# Patient Record
Sex: Female | Born: 1935 | Race: White | Hispanic: No
Health system: Southern US, Community
[De-identification: ages and names within clinical notes are randomized; demographics above are authoritative.]

## PROBLEM LIST (undated history)

## (undated) DIAGNOSIS — F0390 Unspecified dementia without behavioral disturbance: Secondary | ICD-10-CM

## (undated) DIAGNOSIS — I10 Essential (primary) hypertension: Secondary | ICD-10-CM

## (undated) DIAGNOSIS — F0280 Dementia in other diseases classified elsewhere without behavioral disturbance: Secondary | ICD-10-CM

---

## 2020-03-02 ENCOUNTER — Other Ambulatory Visit: Payer: Self-pay

## 2020-03-02 ENCOUNTER — Encounter: Payer: Self-pay | Admitting: Emergency Medicine

## 2020-03-02 DIAGNOSIS — Y929 Unspecified place or not applicable: Secondary | ICD-10-CM | POA: Diagnosis not present

## 2020-03-02 DIAGNOSIS — W19XXXA Unspecified fall, initial encounter: Secondary | ICD-10-CM | POA: Insufficient documentation

## 2020-03-02 DIAGNOSIS — I10 Essential (primary) hypertension: Secondary | ICD-10-CM | POA: Insufficient documentation

## 2020-03-02 DIAGNOSIS — Y939 Activity, unspecified: Secondary | ICD-10-CM | POA: Diagnosis not present

## 2020-03-02 DIAGNOSIS — Y998 Other external cause status: Secondary | ICD-10-CM | POA: Diagnosis not present

## 2020-03-02 DIAGNOSIS — G309 Alzheimer's disease, unspecified: Secondary | ICD-10-CM | POA: Insufficient documentation

## 2020-03-02 DIAGNOSIS — S0003XA Contusion of scalp, initial encounter: Secondary | ICD-10-CM | POA: Insufficient documentation

## 2020-03-02 DIAGNOSIS — S0000XA Unspecified superficial injury of scalp, initial encounter: Secondary | ICD-10-CM | POA: Diagnosis present

## 2020-03-02 HISTORY — DX: Essential (primary) hypertension: I10

## 2020-03-02 HISTORY — DX: Unspecified dementia, unspecified severity, without behavioral disturbance, psychotic disturbance, mood disturbance, and anxiety: F03.90

## 2020-03-02 HISTORY — DX: Dementia in other diseases classified elsewhere, unspecified severity, without behavioral disturbance, psychotic disturbance, mood disturbance, and anxiety: F02.80

## 2020-03-02 NOTE — ED Triage Notes (Signed)
Presents via EMS from [facility] s/p unwitnessed fall. Hx Dementia.

## 2020-03-02 NOTE — ED Triage Notes (Signed)
Pt comes from [facility] via EMS complaints unwitnessed fall, pt reports she has a "knot" on her head, visible swelling noted to occipital area. Pt talks in complete sentences, alert to self, pt has history of Dementia

## 2020-03-03 ENCOUNTER — Emergency Department: Payer: Medicare Other

## 2020-03-03 ENCOUNTER — Emergency Department
Admission: EM | Admit: 2020-03-03 | Discharge: 2020-03-03 | Disposition: A | Discharge status: Skilled Nursing Facility | Payer: Medicare Other | Attending: Emergency Medicine | Admitting: Emergency Medicine

## 2020-03-03 ENCOUNTER — Encounter: Payer: Self-pay | Admitting: Emergency Medicine

## 2020-03-03 ENCOUNTER — Emergency Department
Admission: EM | Admit: 2020-03-03 | Discharge: 2020-03-03 | Disposition: A | Discharge status: Home / Self Care | Payer: Medicare Other | Attending: Emergency Medicine | Admitting: Emergency Medicine

## 2020-03-03 DIAGNOSIS — Y9389 Activity, other specified: Secondary | ICD-10-CM | POA: Diagnosis not present

## 2020-03-03 DIAGNOSIS — I10 Essential (primary) hypertension: Secondary | ICD-10-CM | POA: Insufficient documentation

## 2020-03-03 DIAGNOSIS — W19XXXA Unspecified fall, initial encounter: Secondary | ICD-10-CM | POA: Diagnosis not present

## 2020-03-03 DIAGNOSIS — G309 Alzheimer's disease, unspecified: Secondary | ICD-10-CM | POA: Insufficient documentation

## 2020-03-03 DIAGNOSIS — Z23 Encounter for immunization: Secondary | ICD-10-CM | POA: Insufficient documentation

## 2020-03-03 DIAGNOSIS — Z79899 Other long term (current) drug therapy: Secondary | ICD-10-CM | POA: Insufficient documentation

## 2020-03-03 DIAGNOSIS — Z20822 Contact with and (suspected) exposure to covid-19: Secondary | ICD-10-CM | POA: Insufficient documentation

## 2020-03-03 DIAGNOSIS — S0003XA Contusion of scalp, initial encounter: Secondary | ICD-10-CM | POA: Insufficient documentation

## 2020-03-03 DIAGNOSIS — Y998 Other external cause status: Secondary | ICD-10-CM | POA: Insufficient documentation

## 2020-03-03 DIAGNOSIS — Y9289 Other specified places as the place of occurrence of the external cause: Secondary | ICD-10-CM | POA: Insufficient documentation

## 2020-03-03 DIAGNOSIS — S50311A Abrasion of right elbow, initial encounter: Secondary | ICD-10-CM | POA: Insufficient documentation

## 2020-03-03 DIAGNOSIS — R4182 Altered mental status, unspecified: Secondary | ICD-10-CM | POA: Diagnosis present

## 2020-03-03 LAB — CBC WITH DIFFERENTIAL/PLATELET
Abs Immature Granulocytes: 0.05 10*3/uL (ref 0.00–0.07)
Basophils Absolute: 0 10*3/uL (ref 0.0–0.1)
Basophils Relative: 1 %
Eosinophils Absolute: 0.1 10*3/uL (ref 0.0–0.5)
Eosinophils Relative: 1 %
HCT: 39.8 % (ref 36.0–46.0)
Hemoglobin: 13.4 g/dL (ref 12.0–15.0)
Immature Granulocytes: 1 %
Lymphocytes Relative: 13 %
Lymphs Abs: 1 10*3/uL (ref 0.7–4.0)
MCH: 31.8 pg (ref 26.0–34.0)
MCHC: 33.7 g/dL (ref 30.0–36.0)
MCV: 94.3 fL (ref 80.0–100.0)
Monocytes Absolute: 0.5 10*3/uL (ref 0.1–1.0)
Monocytes Relative: 7 %
Neutro Abs: 6.4 10*3/uL (ref 1.7–7.7)
Neutrophils Relative %: 77 %
Platelets: 129 10*3/uL — ABNORMAL LOW (ref 150–400)
RBC: 4.22 MIL/uL (ref 3.87–5.11)
RDW: 13 % (ref 11.5–15.5)
WBC: 8.1 10*3/uL (ref 4.0–10.5)
nRBC: 0 % (ref 0.0–0.2)

## 2020-03-03 LAB — BASIC METABOLIC PANEL
Anion gap: 15 (ref 5–15)
BUN: 15 mg/dL (ref 8–23)
CO2: 24 mmol/L (ref 22–32)
Calcium: 9.3 mg/dL (ref 8.9–10.3)
Chloride: 102 mmol/L (ref 98–111)
Creatinine, Ser: 0.59 mg/dL (ref 0.44–1.00)
GFR calc Af Amer: >60 mL/min (ref >60)
GFR calc non Af Amer: >60 mL/min (ref >60)
Glucose, Bld: 127 mg/dL — ABNORMAL HIGH (ref 70–99)
Potassium: 3.5 mmol/L (ref 3.5–5.1)
Sodium: 141 mmol/L (ref 135–145)

## 2020-03-03 LAB — COMPREHENSIVE METABOLIC PANEL
ALT: 24 U/L (ref 0–44)
AST: 34 U/L (ref 15–41)
Albumin: 4.3 g/dL (ref 3.5–5.0)
Alkaline Phosphatase: 86 U/L (ref 38–126)
Anion gap: 7 (ref 5–15)
BUN: 13 mg/dL (ref 8–23)
CO2: 28 mmol/L (ref 22–32)
Calcium: 9.2 mg/dL (ref 8.9–10.3)
Chloride: 105 mmol/L (ref 98–111)
Creatinine, Ser: 0.73 mg/dL (ref 0.44–1.00)
GFR calc Af Amer: >60 mL/min (ref >60)
GFR calc non Af Amer: >60 mL/min (ref >60)
Glucose, Bld: 128 mg/dL — ABNORMAL HIGH (ref 70–99)
Potassium: 3.5 mmol/L (ref 3.5–5.1)
Sodium: 140 mmol/L (ref 135–145)
Total Bilirubin: 1 mg/dL (ref 0.3–1.2)
Total Protein: 7.2 g/dL (ref 6.5–8.1)

## 2020-03-03 LAB — CBC
HCT: 38.3 % (ref 36.0–46.0)
Hemoglobin: 12.8 g/dL (ref 12.0–15.0)
MCH: 31.9 pg (ref 26.0–34.0)
MCHC: 33.4 g/dL (ref 30.0–36.0)
MCV: 95.5 fL (ref 80.0–100.0)
Platelets: 192 10*3/uL (ref 150–400)
RBC: 4.01 MIL/uL (ref 3.87–5.11)
RDW: 12.9 % (ref 11.5–15.5)
WBC: 12.7 10*3/uL — ABNORMAL HIGH (ref 4.0–10.5)
nRBC: 0 % (ref 0.0–0.2)

## 2020-03-03 LAB — SARS CORONAVIRUS 2 BY RT PCR (HOSPITAL ORDER, PERFORMED IN ~~LOC~~ HOSPITAL LAB): SARS Coronavirus 2: NEGATIVE

## 2020-03-03 MED ORDER — DIPHENHYDRAMINE HCL 50 MG/ML IJ SOLN
50.0000 mg | Freq: Once | INTRAMUSCULAR | Status: AC
  Administered 2020-03-03: 50 mg via INTRAVENOUS
  Filled 2020-03-03: qty 1

## 2020-03-03 MED ORDER — ACETAMINOPHEN 500 MG PO TABS
1000.0000 mg | ORAL_TABLET | Freq: Once | ORAL | Status: AC
  Administered 2020-03-03: 1000 mg via ORAL
  Filled 2020-03-03: qty 2

## 2020-03-03 MED ORDER — TETANUS-DIPHTH-ACELL PERTUSSIS 5-2.5-18.5 LF-MCG/0.5 IM SUSP
0.5000 mL | Freq: Once | INTRAMUSCULAR | Status: AC
  Administered 2020-03-03: 0.5 mL via INTRAMUSCULAR
  Filled 2020-03-03: qty 0.5

## 2020-03-03 MED ORDER — HALOPERIDOL LACTATE 5 MG/ML IJ SOLN
5.0000 mg | Freq: Once | INTRAMUSCULAR | Status: AC
  Administered 2020-03-03: 5 mg via INTRAVENOUS
  Filled 2020-03-03: qty 1

## 2020-03-03 MED ORDER — LORAZEPAM 2 MG/ML IJ SOLN
1.0000 mg | Freq: Once | INTRAMUSCULAR | Status: AC
  Administered 2020-03-03: 1 mg via INTRAVENOUS
  Filled 2020-03-03: qty 1

## 2020-03-03 NOTE — ED Notes (Signed)
Pt has made her way to the end of stretcher and is attempting to stand up. This Ambulance person Rn at bedside. Pt provided dry brief (had previously urinated) and paper scrub pants. Pt attempting to walk toward door. Pt assisted back into bed with staff and placed in 5H to be watched d/t high fall risk.

## 2020-03-03 NOTE — ED Notes (Signed)
Pt taken to scans  

## 2020-03-03 NOTE — ED Provider Notes (Signed)
[facility] Emergency Department Provider Note  ____________________________________________   First MD Initiated Contact with Patient 03/03/20 1332     (approximate)  I have reviewed the triage vital signs and the nursing notes.   HISTORY  Chief Complaint Fall   HPI Katherine Glover is a 86 y.o. female with a past medical history of Alzheimer's dementia and hypertension who presents via EMS from her memory care unit at [facility] after concerns for a fall with patient being found on the floor.  Of note patient was recently discharged earlier today back to facility after she was brought in for assessment for same complaint.  Patient is unable provide any history secondary to altered mental status.  I did attempt to call patient's facility twice and no one answered the phone.  No other additional history is immediately available patient arrival.            Past Medical History:  Diagnosis Date  . Alzheimer disease (HCC)   . Dementia (HCC)   . Hypertension     There are no problems to display for this patient.   History reviewed. No pertinent surgical history.  Prior to Admission medications   Medication Sig Start Date End Date Taking? Authorizing Provider  carbidopa-levodopa (SINEMET IR) 25-100 MG tablet Take 1 tablet by mouth in the morning.    [provider]  carbidopa-levodopa (SINEMET IR) 25-100 MG tablet Take 2 tablets by mouth every evening.    [provider]  clonazePAM (KLONOPIN) 0.5 MG tablet Take 0.5 mg by mouth every evening.    [provider]  clonazePAM (KLONOPIN) 0.5 MG tablet Take 0.25 mg by mouth in the morning.    [provider]  levETIRAcetam (KEPPRA) 500 MG tablet Take 500 mg by mouth 2 (two) times daily.    [provider]  lisinopril (ZESTRIL) 20 MG tablet Take 20 mg by mouth daily.    [provider]  Multiple Vitamins-Minerals (CERTAVITE SENIOR) TABS Take 1 tablet by  mouth daily.    [provider]  Multiple Vitamins-Minerals (ICAPS MV) TABS Take 1 tablet by mouth daily.    [provider]    Allergies Patient has no known allergies.  History reviewed. No pertinent family history.  Social History Social History   Tobacco Use  . Smoking status: Unknown If Ever Smoked  . Smokeless tobacco: Never Used  Substance Use Topics  . Alcohol use: Not Currently  . Drug use: Never    Review of Systems  Review of Systems  Unable to perform ROS: Dementia      ____________________________________________   PHYSICAL EXAM:  VITAL SIGNS: ED Triage Vitals  Enc Vitals Group     BP      Pulse      Resp      Temp      Temp src      SpO2      Weight      Height      Head Circumference      Peak Flow      Pain Score      Pain Loc      Pain Edu?      Excl. in GC?    Vitals:   03/03/20 1332  BP: (!) 167/91  Pulse: 90  Resp: 18  Temp: 97.8 F (36.6 C)  SpO2: 100%   Physical Exam Vitals and nursing note reviewed.  Constitutional:      Appearance: She is ill-appearing.  Eyes:     Extraocular Movements: Extraocular movements intact.     Conjunctiva/sclera: Conjunctivae normal.     Pupils: Pupils are equal, round, and reactive to light.  Cardiovascular:     Rate and Rhythm: Normal rate and regular rhythm.     Pulses: Normal pulses.  Pulmonary:     Effort: Pulmonary effort is normal. No respiratory distress.  Abdominal:     General: There is no distension.     Tenderness: There is no abdominal tenderness.  Musculoskeletal:     Right lower leg: No edema.     Left lower leg: No edema.  Skin:    General: Skin is warm.     Capillary Refill: Capillary refill takes less than 2 seconds.  Neurological:     General: No focal deficit present.     Mental Status: Mental status is at baseline. She is disoriented and confused.     Right scalp hematoma.  No step-offs or deformities over the C or T-spine.  There is an  abrasion over the right elbow.  2+ bilateral radial and DP pulses.  No other obvious trauma to the patient's chest, abdomen, back, or extremities.  Patient is noted to be moaning in bed and does not follow commands. ____________________________________________   LABS (all labs ordered are listed, but only abnormal results are displayed)  Labs Reviewed  CBC WITH DIFFERENTIAL/PLATELET - Abnormal; Notable for the following components:      Result Value   Platelets 129 (*)    All other components within normal limits  COMPREHENSIVE METABOLIC PANEL - Abnormal; Notable for the following components:   Glucose, Bld 128 (*)    All other components within normal limits  SARS CORONAVIRUS 2 BY RT PCR (HOSPITAL ORDER, PERFORMED IN [facility])   ____________________________________________  EKG  Sinus rhythm with a ventricular rate of 89, left axis deviation, right bundle branch block, left anterior fascicle block, QTC of 504, poor tracing in multiple leads with no clear evidence of acute ischemia. ____________________________________________  RADIOLOGY  ED MD interpretation: No evidence of calvarial skull fracture, intracranial bleeding, or acute fracture of the ribs, pelvis, or the right elbow.  Official radiology report(s): DG Chest 1 View  Result Date: 03/03/2020 CLINICAL DATA:  Status post unwitnessed fall. EXAM: CHEST  1 VIEW COMPARISON:  None. FINDINGS: Cardiomediastinal silhouette is normal. Mediastinal contours appear intact. Tortuosity of the aorta. There is no evidence of focal airspace consolidation, pleural effusion or pneumothorax. Osseous structures are without acute abnormality. Soft tissues are grossly normal. IMPRESSION: No active disease. Electronically Signed   By: Ted Mcalpine M.D.   On: 03/03/2020 14:54   DG Elbow 2 Views Right  Result Date: 03/03/2020 CLINICAL DATA:  Fall, pain. EXAM: RIGHT ELBOW - 2 VIEW COMPARISON:  None. FINDINGS: Osseous alignment is  normal. No fracture line or displaced fracture fragment is seen. No evidence of joint effusion at the RIGHT elbow. Questionable soft tissue swelling posterior to the elbow. IMPRESSION: 1. No osseous fracture or dislocation. 2. Questionable soft tissue swelling posterior to the elbow. Electronically Signed   By: Bary Richard M.D.   On: 03/03/2020 14:54   CT Head Wo Contrast  Result Date: 03/03/2020 CLINICAL DATA:  Unwitnessed fall, patient found supine in bathroom. Apparently this was a different fall from the fall earlier this morning. EXAM: CT HEAD WITHOUT CONTRAST CT CERVICAL SPINE WITHOUT CONTRAST TECHNIQUE: Multidetector CT imaging of the head and cervical spine was performed following the standard protocol without  intravenous contrast. Multiplanar CT image reconstructions of the cervical spine were also generated. COMPARISON:  Earlier CT head and earlier CT cervical spine from 03/03/2020 at 4:51 a.m. FINDINGS: CT HEAD FINDINGS Brain: The brainstem, cerebellum, cerebral peduncles, thalami, basal ganglia, basilar cisterns, and ventricular system appear within normal limits. No intracranial hemorrhage, mass lesion, or acute CVA. Vascular: Minimal atherosclerotic calcification of the cavernous carotid arteries. Otherwise unremarkable. Skull: Mild hyperostosis frontalis interna. Sinuses/Orbits: Mild chronic ethmoid sinusitis. Other: Stable right occipitoparietal scalp hematoma. CT CERVICAL SPINE FINDINGS Alignment: Degenerative mild grade 1 anterolisthesis at C4-5 and C7-T1 as well as T1-2 and T2-3, not appreciably changed. Skull base and vertebrae: Fused left facet joints and mild interspinous fusion at C2-3, chronic. Degenerative arthropathy at C1-2 anteriorly and on the right. No cervical spine fracture or acute subluxation is identified. Minimal anterior wedging at T1, probably chronic, and unchanged. No significant prevertebral soft tissue swelling. Soft tissues and spinal canal: Mild nodularity of the  thyroid gland measuring up to 1.4 cm in diameter. Not clinically significant; no follow-up imaging recommended (ref: J Am Coll Radiol. 2015 Feb;12(2): 143-50).Mild bilateral common carotid atherosclerotic calcification. Disc levels: Cervical spondylosis and uncinate spurring but without overt foraminal impingement. Upper chest: Mild biapical pleuroparenchymal scarring. Other: No supplemental non-categorized findings. IMPRESSION: 1. No acute intracranial findings or acute cervical spine findings. 2. Stable right occipitoparietal scalp hematoma. 3. Mild chronic ethmoid sinusitis. 4. Cervical spondylosis and uncinate spurring. Electronically Signed   By: Gaylyn Rong M.D.   On: 03/03/2020 14:43   CT Head Wo Contrast  Result Date: 03/03/2020 CLINICAL DATA:  Fall EXAM: CT HEAD WITHOUT CONTRAST CT CERVICAL SPINE WITHOUT CONTRAST TECHNIQUE: Multidetector CT imaging of the head and cervical spine was performed following the standard protocol without intravenous contrast. Multiplanar CT image reconstructions of the cervical spine were also generated. COMPARISON:  None. FINDINGS: CT HEAD FINDINGS Brain: There is no mass, hemorrhage or extra-axial collection. There is generalized atrophy without lobar predilection. The brain parenchyma is normal, without evidence of acute or chronic infarction. Vascular: No abnormal hyperdensity of the major intracranial arteries or dural venous sinuses. No intracranial atherosclerosis. Skull: Right subgaleal scalp hematoma.  No skull fracture. Sinuses/Orbits: No fluid levels or advanced mucosal thickening of the visualized paranasal sinuses. No mastoid or middle ear effusion. The orbits are normal. CT CERVICAL SPINE FINDINGS Alignment: No static subluxation. Facets are aligned. Occipital condyles are normally positioned. Skull base and vertebrae: No acute fracture. Soft tissues and spinal canal: No prevertebral fluid or swelling. No visible canal hematoma. Disc levels: Multilevel  facet arthrosis Upper chest: No pneumothorax, pulmonary nodule or pleural effusion. Other: Normal visualized paraspinal cervical soft tissues. IMPRESSION: 1. No acute intracranial abnormality. 2. Right subgaleal scalp hematoma without skull fracture. 3. No acute fracture or static subluxation of the cervical spine. Electronically Signed   By: Deatra Robinson M.D.   On: 03/03/2020 05:30   CT Cervical Spine Wo Contrast  Result Date: 03/03/2020 CLINICAL DATA:  Unwitnessed fall, patient found supine in bathroom. Apparently this was a different fall from the fall earlier this morning. EXAM: CT HEAD WITHOUT CONTRAST CT CERVICAL SPINE WITHOUT CONTRAST TECHNIQUE: Multidetector CT imaging of the head and cervical spine was performed following the standard protocol without intravenous contrast. Multiplanar CT image reconstructions of the cervical spine were also generated. COMPARISON:  Earlier CT head and earlier CT cervical spine from 03/03/2020 at 4:51 a.m. FINDINGS: CT HEAD FINDINGS Brain: The brainstem, cerebellum, cerebral peduncles, thalami, basal ganglia, basilar cisterns, and ventricular  system appear within normal limits. No intracranial hemorrhage, mass lesion, or acute CVA. Vascular: Minimal atherosclerotic calcification of the cavernous carotid arteries. Otherwise unremarkable. Skull: Mild hyperostosis frontalis interna. Sinuses/Orbits: Mild chronic ethmoid sinusitis. Other: Stable right occipitoparietal scalp hematoma. CT CERVICAL SPINE FINDINGS Alignment: Degenerative mild grade 1 anterolisthesis at C4-5 and C7-T1 as well as T1-2 and T2-3, not appreciably changed. Skull base and vertebrae: Fused left facet joints and mild interspinous fusion at C2-3, chronic. Degenerative arthropathy at C1-2 anteriorly and on the right. No cervical spine fracture or acute subluxation is identified. Minimal anterior wedging at T1, probably chronic, and unchanged. No significant prevertebral soft tissue swelling. Soft tissues and  spinal canal: Mild nodularity of the thyroid gland measuring up to 1.4 cm in diameter. Not clinically significant; no follow-up imaging recommended (ref: J Am Coll Radiol. 2015 Feb;12(2): 143-50).Mild bilateral common carotid atherosclerotic calcification. Disc levels: Cervical spondylosis and uncinate spurring but without overt foraminal impingement. Upper chest: Mild biapical pleuroparenchymal scarring. Other: No supplemental non-categorized findings. IMPRESSION: 1. No acute intracranial findings or acute cervical spine findings. 2. Stable right occipitoparietal scalp hematoma. 3. Mild chronic ethmoid sinusitis. 4. Cervical spondylosis and uncinate spurring. Electronically Signed   By: Gaylyn RongWalter  Liebkemann M.D.   On: 03/03/2020 14:43   CT Cervical Spine Wo Contrast  Result Date: 03/03/2020 CLINICAL DATA:  Fall EXAM: CT HEAD WITHOUT CONTRAST CT CERVICAL SPINE WITHOUT CONTRAST TECHNIQUE: Multidetector CT imaging of the head and cervical spine was performed following the standard protocol without intravenous contrast. Multiplanar CT image reconstructions of the cervical spine were also generated. COMPARISON:  None. FINDINGS: CT HEAD FINDINGS Brain: There is no mass, hemorrhage or extra-axial collection. There is generalized atrophy without lobar predilection. The brain parenchyma is normal, without evidence of acute or chronic infarction. Vascular: No abnormal hyperdensity of the major intracranial arteries or dural venous sinuses. No intracranial atherosclerosis. Skull: Right subgaleal scalp hematoma.  No skull fracture. Sinuses/Orbits: No fluid levels or advanced mucosal thickening of the visualized paranasal sinuses. No mastoid or middle ear effusion. The orbits are normal. CT CERVICAL SPINE FINDINGS Alignment: No static subluxation. Facets are aligned. Occipital condyles are normally positioned. Skull base and vertebrae: No acute fracture. Soft tissues and spinal canal: No prevertebral fluid or swelling. No  visible canal hematoma. Disc levels: Multilevel facet arthrosis Upper chest: No pneumothorax, pulmonary nodule or pleural effusion. Other: Normal visualized paraspinal cervical soft tissues. IMPRESSION: 1. No acute intracranial abnormality. 2. Right subgaleal scalp hematoma without skull fracture. 3. No acute fracture or static subluxation of the cervical spine. Electronically Signed   By: Deatra RobinsonKevin  Herman M.D.   On: 03/03/2020 05:30   DG Pelvis Portable  Result Date: 03/03/2020 CLINICAL DATA:  Unwitnessed fall. EXAM: PORTABLE PELVIS 1-2 VIEWS COMPARISON:  None. FINDINGS: There is no evidence of acute pelvic fracture or diastasis. No pelvic bone lesions are seen. Status post surgical Isabell Jarvisera of old proximal left femoral fracture. IMPRESSION: No acute abnormality seen in the pelvis. Electronically Signed   By: Lupita RaiderJames  Green Jr M.D.   On: 03/03/2020 14:57    ____________________________________________   PROCEDURES  Procedure(s) performed (including Critical Care):  Procedures   ____________________________________________   INITIAL IMPRESSION / ASSESSMENT AND PLAN / ED COURSE        Patient presents with us to history exam for assessment after concern for another fall in 24 hours at her facility.  Patient is afebrile hemodynamically stable on arrival.  He is moaning in pain and is not oriented although this is  her neurological baseline.  Exam notable for hematoma over the right scalp which is present on prior documentation from fall she was evaluated for last night but she does seem to have a new abrasion of her right elbow.  No other obvious evidence of injuries to the patient's chest, abdomen, extremities, back, or face.  No significant electrolyte or metabolic derangements.  On reassessment patient mentating little better stating she does remember falling and hitting her head although she is not sure why.  Was eventually able to get through the patient's facility state they found patient on the  floor.  Given stable vital signs with reassuring work-up and exam and no other evidence of other significant orthopedic or vessel injury I believe she is safe for discharge back to facility.  Did strongly emphasized to patient staff that patient should be monitored closely and likely given her severe dementia should not be left unsupervised for any extended period of time.  Discharged stable condition.  ____________________________________________   FINAL CLINICAL IMPRESSION(S) / ED DIAGNOSES  Final diagnoses:  Hematoma of scalp, initial encounter  Abrasion of right elbow, initial encounter    Medications  acetaminophen (TYLENOL) tablet 1,000 mg (1,000 mg Oral Given 03/03/20 1512)  Tdap (BOOSTRIX) injection 0.5 mL (0.5 mLs Intramuscular Given 03/03/20 1512)     ED Discharge Orders    None       Note:  This document was prepared using Dragon voice recognition software and may include unintentional dictation errors.   Gilles Chiquito, MD 03/03/20 [phone]

## 2020-03-03 NOTE — ED Triage Notes (Signed)
Pt arrives from [facility] with complaint of fall. Pt was d/c this AM, sedated for agitation, and sedated again at [facility], somewhat sedated on arrival. Pt recieved B52 at facility Found on back in bathroom. Fall unwitnessed.   BP 166/80 HR 80's C-collar in place from facility

## 2020-03-03 NOTE — ED Notes (Signed)
Patient changed into dry diaper at this time.

## 2020-03-03 NOTE — ED Notes (Signed)
Attempted to call report to [facility] x 3.

## 2020-03-03 NOTE — ED Notes (Signed)
Attempted to call report to [facility] x 2 for patient. This RN will attempt again later.

## 2020-03-03 NOTE — ED Notes (Signed)
Pt to CT at this time.

## 2020-03-03 NOTE — ED Notes (Signed)
EMS transport arrived to take patient back to facility.

## 2020-03-03 NOTE — ED Notes (Signed)
Pt becoming increasingly restless, expressing desire to leave. At this time, patient is beginning to attempt getting out of bed. Pt able to be redirected back to bed at this time.   EMS still en route with no update on ETA

## 2020-03-03 NOTE — ED Provider Notes (Signed)
[facility] Emergency Department Provider Note  ____________________________________________   I have reviewed the triage vital signs and the nursing notes.   HISTORY  Chief Complaint Fall   History limited by and level 5 caveat due to: Dementia  HPI Katherine Glover is a 86 y.o. female who presents to the emergency department today from living facility because of concern for fall. The patient herself could not give good history, stating that she fell because she fell. She denies any pain. She is quite agitated on exam.    Records reviewed. Per medical record review patient has a history of dementia, hypertension.   Past Medical History:  Diagnosis Date   Alzheimer disease (HCC)    Dementia (HCC)    Hypertension     There are no problems to display for this patient.   History reviewed. No pertinent surgical history.  Prior to Admission medications   Not on File    Allergies Patient has no allergy information on record.  No family history on file.  Social History Social History   Tobacco Use   Smoking status: Unknown If Ever Smoked  Substance Use Topics   Alcohol use: Not Currently   Drug use: Not on file    Review of Systems Unable to obtain reliable ROS.  ____________________________________________   PHYSICAL EXAM:  VITAL SIGNS: ED Triage Vitals  Enc Vitals Group     BP 03/02/20 2302 138/72     Pulse Rate 03/02/20 2302 67     Resp 03/02/20 2302 20     Temp 03/02/20 2302 97.9 F (36.6 C)     Temp Source 03/02/20 2302 Oral     SpO2 03/02/20 2302 100 %     Weight 03/02/20 2304 152 lb (68.9 kg)     Height 03/02/20 2304 5\' 3"  (1.6 m)   Constitutional: Awake, alert. Agitated.  Eyes: Conjunctivae are normal.  ENT      Head: Normocephalic. Occipital hematoma.      Nose: No congestion/rhinnorhea.      Mouth/Throat: Mucous membranes are moist.      Neck: No stridor. Hematological/Lymphatic/Immunilogical: No cervical  lymphadenopathy. Cardiovascular: Normal rate, regular rhythm.  No murmurs, rubs, or gallops.  Respiratory: Normal respiratory effort without tachypnea nor retractions. Breath sounds are clear and equal bilaterally. No wheezes/rales/rhonchi. Gastrointestinal: Soft and non tender. No rebound. No guarding.  Genitourinary: Deferred Musculoskeletal: Normal range of motion in all extremities. No lower extremity edema. Neurologic:  Dementia. Moving all extremities.  Skin:  Skin is warm, dry and intact. No rash noted. Psychiatric: Agitated. ____________________________________________    LABS (pertinent positives/negatives)  CBC wbc 12.7, hgb 12.8, plt 192 BMP wnl except glu 127  ____________________________________________   EKG  None  ____________________________________________    RADIOLOGY  CT head/cervical spine No osseous traumatic injury. No intracranial abnormality  ____________________________________________   PROCEDURES  Procedures  ____________________________________________   INITIAL IMPRESSION / ASSESSMENT AND PLAN / ED COURSE  Pertinent labs & imaging results that were available during my care of the patient were reviewed by me and considered in my medical decision making (see chart for details).   Patient presented to the emergency department today because of concern for fall. On exam patient was very agitated. Did have hematoma on exam. Did require calming medications so CT scan could be performed. This did not show any concerning abnormalities. Will plan on discharging home.   ____________________________________________   FINAL CLINICAL IMPRESSION(S) / ED DIAGNOSES  Final diagnoses:  Fall, initial encounter  Hematoma of scalp, initial encounter     Note: This dictation was prepared with Dragon dictation. Any transcriptional errors that result from this process are unintentional     Phineas Semen, MD 03/03/20 [phone]

## 2020-03-03 NOTE — ED Notes (Signed)
Called [facility] for transport to [facility]

## 2020-03-03 NOTE — ED Notes (Signed)
Pt baseline demented. Multiple recent falls. Pt with agitation that required sedation per facility. Per EMS, sedation appeared to be wearing off  Pt moaning and groaning on arrival, reporting pain but unable to report reliably on pain location.   Pt with mild abrasion to right elbow, possible shortening of left leg, pt able to verbalize pain to head.

## 2020-03-03 NOTE — ED Notes (Signed)
Pt more alert and oriented now. Pt no longer sedated, oriented to self, place, and situation. Pt asking about contacting son, Trey Paula, who is not in chart.   Pt verbalizes pain to neck and head. Refuses attempt to ambulate at this time

## 2020-03-03 NOTE — Discharge Instructions (Signed)
Please seek medical attention for any high fevers, chest pain, shortness of breath, change in behavior, persistent vomiting, bloody stool or any other new or concerning symptoms.  

## 2020-03-03 NOTE — ED Notes (Signed)
Pt with increased disorientation. Pt states she feels as though people are not being honest with her, repeatedly calls out for help, states she wants to leave, and insists she is not wearing all of her clothes.  To best of this RN's ability, pt reoriented, assured she is dressed in her clothes, and the ambulance has been called to take her back. IV removed per request and transport needs   Pt repeatedly calls out "please help me" with no further requests. Needs addressed to the best of this RN's ability

## 2020-03-03 NOTE — ED Notes (Signed)
Was alerted by Eileen Stanford, rn that pt has fallen. Pt was back in bed with staff at bedside, siderails up and yellow non skid socks inplace. This RN at bedside at this time to keep pt in bed while awaiting medication. Pt is kicking, punching and hitting staff.

## 2020-03-03 NOTE — ED Notes (Signed)
[facility] called, report given, will put in for EMS trans back

## 2020-03-08 DIAGNOSIS — F0281 Dementia in other diseases classified elsewhere with behavioral disturbance: Secondary | ICD-10-CM | POA: Diagnosis not present

## 2020-03-08 DIAGNOSIS — G309 Alzheimer's disease, unspecified: Secondary | ICD-10-CM | POA: Insufficient documentation

## 2020-03-08 DIAGNOSIS — I10 Essential (primary) hypertension: Secondary | ICD-10-CM | POA: Insufficient documentation

## 2020-03-08 DIAGNOSIS — Z20822 Contact with and (suspected) exposure to covid-19: Secondary | ICD-10-CM | POA: Insufficient documentation

## 2020-03-08 DIAGNOSIS — F989 Unspecified behavioral and emotional disorders with onset usually occurring in childhood and adolescence: Secondary | ICD-10-CM | POA: Diagnosis present

## 2020-03-08 DIAGNOSIS — Z79899 Other long term (current) drug therapy: Secondary | ICD-10-CM | POA: Insufficient documentation

## 2020-03-08 LAB — COMPREHENSIVE METABOLIC PANEL
ALT: 23 U/L (ref 0–44)
AST: 31 U/L (ref 15–41)
Albumin: 4 g/dL (ref 3.5–5.0)
Alkaline Phosphatase: 83 U/L (ref 38–126)
Anion gap: 9 (ref 5–15)
BUN: 16 mg/dL (ref 8–23)
CO2: 28 mmol/L (ref 22–32)
Calcium: 9.3 mg/dL (ref 8.9–10.3)
Chloride: 102 mmol/L (ref 98–111)
Creatinine, Ser: 0.53 mg/dL (ref 0.44–1.00)
GFR calc Af Amer: >60 mL/min (ref >60)
GFR calc non Af Amer: >60 mL/min (ref >60)
Glucose, Bld: 116 mg/dL — ABNORMAL HIGH (ref 70–99)
Potassium: 3.2 mmol/L — ABNORMAL LOW (ref 3.5–5.1)
Sodium: 139 mmol/L (ref 135–145)
Total Bilirubin: 1.1 mg/dL (ref 0.3–1.2)
Total Protein: 7 g/dL (ref 6.5–8.1)

## 2020-03-08 LAB — URINE DRUG SCREEN, QUALITATIVE (ARMC ONLY)
Amphetamines, Ur Screen: NOT DETECTED
Barbiturates, Ur Screen: POSITIVE — AB
Benzodiazepine, Ur Scrn: POSITIVE — AB
Cannabinoid 50 Ng, Ur ~~LOC~~: NOT DETECTED
Cocaine Metabolite,Ur ~~LOC~~: NOT DETECTED
MDMA (Ecstasy)Ur Screen: NOT DETECTED
Methadone Scn, Ur: NOT DETECTED
Opiate, Ur Screen: NOT DETECTED
Phencyclidine (PCP) Ur S: NOT DETECTED
Tricyclic, Ur Screen: NOT DETECTED

## 2020-03-08 LAB — CBC
HCT: 38.2 % (ref 36.0–46.0)
Hemoglobin: 12.8 g/dL (ref 12.0–15.0)
MCH: 31.1 pg (ref 26.0–34.0)
MCHC: 33.5 g/dL (ref 30.0–36.0)
MCV: 92.9 fL (ref 80.0–100.0)
Platelets: 190 10*3/uL (ref 150–400)
RBC: 4.11 MIL/uL (ref 3.87–5.11)
RDW: 12.6 % (ref 11.5–15.5)
WBC: 7.2 10*3/uL (ref 4.0–10.5)
nRBC: 0 % (ref 0.0–0.2)

## 2020-03-08 LAB — ETHANOL: Alcohol, Ethyl (B): <10 mg/dL (ref <10)

## 2020-03-08 NOTE — ED Notes (Signed)
Pt alert to self only, attempting to get out of recliner; pt assisted into w/c, taken to BR to void qs; pt able to get up with standby assistance; pt assisted onto stretcher with warm blankets for comfort; will continue to monitor

## 2020-03-08 NOTE — ED Notes (Addendum)
Pt dressed out in hospital provided clothing by this nurse and Alissa, Tech. Items collected include: 1 pair blue non-skid socks 1 white pants with flower 1 navy t shirt 1 white purse with her belongings in it 1 silver watch 1 gold ring with red and green stones   Pt arrives with left wrist wrapped in kerlex to cover a skin tear

## 2020-03-08 NOTE — ED Triage Notes (Signed)
See previous notes in regards to pts reason for visit. Pt in triage is confused, pt has loose associates, bouncing from one topic to the next that do not connect with care. Pt unable to provide with history or express why she is here.

## 2020-03-08 NOTE — ED Triage Notes (Addendum)
Pt daughter Katherine Glover called and reported that her mother needed a psych evaluation and possible admission. Daughter reports that pt was seen in the ED for the same as well recently and discharged. Pt daughter can be contacted at [phone]

## 2020-03-08 NOTE — ED Triage Notes (Signed)
Pt in via EMS from [facility] with no complaints. The facility requested pt be seen because she tried to break out a window and picked up a lamp.

## 2020-03-09 ENCOUNTER — Emergency Department
Admission: EM | Admit: 2020-03-09 | Discharge: 2020-03-12 | Disposition: A | Discharge status: Other Acute Inpatient Hospital | Payer: Medicare Other | Attending: Emergency Medicine | Admitting: Emergency Medicine

## 2020-03-09 DIAGNOSIS — G309 Alzheimer's disease, unspecified: Secondary | ICD-10-CM | POA: Diagnosis not present

## 2020-03-09 LAB — URINALYSIS, COMPLETE (UACMP) WITH MICROSCOPIC
Bacteria, UA: NONE SEEN
Bilirubin Urine: NEGATIVE
Glucose, UA: 50 mg/dL — AB
Hgb urine dipstick: NEGATIVE
Ketones, ur: 20 mg/dL — AB
Nitrite: NEGATIVE
Protein, ur: 30 mg/dL — AB
Specific Gravity, Urine: 1.03 (ref 1.005–1.030)
Squamous Epithelial / HPF: NONE SEEN (ref 0–5)
WBC, UA: NONE SEEN WBC/hpf (ref 0–5)
pH: 5 (ref 5.0–8.0)

## 2020-03-09 LAB — SARS CORONAVIRUS 2 BY RT PCR (HOSPITAL ORDER, PERFORMED IN ~~LOC~~ HOSPITAL LAB): SARS Coronavirus 2: NEGATIVE

## 2020-03-09 MED ORDER — OLANZAPINE 2.5 MG PO TABS
1.2500 mg | ORAL_TABLET | Freq: Every day | ORAL | Status: DC
  Administered 2020-03-09 – 2020-03-10 (×2): 1.25 mg via ORAL
  Filled 2020-03-09 (×3): qty 0.5

## 2020-03-09 MED ORDER — MIDAZOLAM HCL 5 MG/5ML IJ SOLN
0.5000 mg | Freq: Once | INTRAMUSCULAR | Status: AC
  Administered 2020-03-09: 0.5 mg via INTRAMUSCULAR
  Filled 2020-03-09: qty 5

## 2020-03-09 MED ORDER — HALOPERIDOL LACTATE 2 MG/ML PO CONC
0.1250 mg | Freq: Three times a day (TID) | ORAL | Status: DC
  Administered 2020-03-09 – 2020-03-11 (×8): 0.12 mg via ORAL
  Filled 2020-03-09: qty 0.06
  Filled 2020-03-09: qty 0.1
  Filled 2020-03-09: qty 0.06
  Filled 2020-03-09 (×7): qty 0.1

## 2020-03-09 MED ORDER — CLONAZEPAM 0.5 MG PO TABS
0.2500 mg | ORAL_TABLET | Freq: Every morning | ORAL | Status: DC
  Administered 2020-03-09: 0.25 mg via ORAL
  Filled 2020-03-09 (×2): qty 1

## 2020-03-09 MED ORDER — NORTRIPTYLINE HCL 10 MG PO CAPS
10.0000 mg | ORAL_CAPSULE | Freq: Every day | ORAL | Status: DC
  Administered 2020-03-09 – 2020-03-10 (×2): 10 mg via ORAL
  Filled 2020-03-09 (×3): qty 1

## 2020-03-09 MED ORDER — OLANZAPINE 5 MG PO TABS: 2.5000 mg | ORAL_TABLET | Freq: Every day | ORAL | Status: DC

## 2020-03-09 MED ORDER — CLONAZEPAM 0.25 MG PO TBDP
0.2500 mg | ORAL_TABLET | Freq: Two times a day (BID) | ORAL | Status: DC
  Administered 2020-03-09 – 2020-03-11 (×5): 0.25 mg via ORAL
  Filled 2020-03-09 (×5): qty 1

## 2020-03-09 MED ORDER — LISINOPRIL 10 MG PO TABS
20.0000 mg | ORAL_TABLET | Freq: Every day | ORAL | Status: DC
  Administered 2020-03-09 – 2020-03-11 (×3): 20 mg via ORAL
  Filled 2020-03-09 (×3): qty 2

## 2020-03-09 MED ORDER — HALOPERIDOL 0.5 MG PO TABS
0.2500 mg | ORAL_TABLET | Freq: Three times a day (TID) | ORAL | Status: DC
  Filled 2020-03-09 (×3): qty 0.5

## 2020-03-09 MED ORDER — CLONAZEPAM 0.5 MG PO TABS: 0.2500 mg | ORAL_TABLET | Freq: Two times a day (BID) | ORAL | Status: DC

## 2020-03-09 MED ORDER — SODIUM CHLORIDE 0.9 % IV BOLUS
1000.0000 mL | Freq: Once | INTRAVENOUS | Status: AC
  Administered 2020-03-09: 1000 mL via INTRAVENOUS

## 2020-03-09 MED ORDER — LEVETIRACETAM 500 MG PO TABS
500.0000 mg | ORAL_TABLET | Freq: Two times a day (BID) | ORAL | Status: DC
  Administered 2020-03-09 – 2020-03-11 (×6): 500 mg via ORAL
  Filled 2020-03-09 (×6): qty 1

## 2020-03-09 MED ORDER — CLONAZEPAM 0.5 MG PO TABS: 0.5000 mg | ORAL_TABLET | Freq: Two times a day (BID) | ORAL | Status: DC

## 2020-03-09 MED ORDER — CARBIDOPA-LEVODOPA 25-100 MG PO TABS
2.0000 | ORAL_TABLET | Freq: Every evening | ORAL | Status: DC
  Administered 2020-03-09 – 2020-03-11 (×3): 2 via ORAL
  Filled 2020-03-09 (×4): qty 2

## 2020-03-09 MED ORDER — CARBIDOPA-LEVODOPA 25-100 MG PO TABS
1.0000 | ORAL_TABLET | Freq: Every morning | ORAL | Status: DC
  Administered 2020-03-09 – 2020-03-11 (×3): 1 via ORAL
  Filled 2020-03-09 (×4): qty 1

## 2020-03-09 NOTE — ED Notes (Signed)
Pt is crying at this time. When asked what is wrong pt stated that her son died and she wants to go home.

## 2020-03-09 NOTE — ED Notes (Signed)
Pt assisted to toilet by this nurse and Lacinda Axon, RN. Pt weak and requires x2 assistance

## 2020-03-09 NOTE — ED Provider Notes (Signed)
[facility] Emergency Department Provider Note  ____________________________________________   First MD Initiated Contact with Patient 03/09/20 0121     (approximate)  I have reviewed the triage vital signs and the nursing notes.  Level 5 caveat history review of system limited secondary to Alzheimer's dementia.  Primarily obtained from EMS and chart review HISTORY  Chief Complaint Psychiatric Evaluation    HPI Katherine Glover is a 86 y.o. female with below list of previous medical conditions including Alzheimer's dementia and hypertension presents to the emergency department from [facility] via EMS secondary to behavior disturbance.  Per report patient attempted to take a lamp in third through a window.  Staff did speak with the patient's daughter who stated that her mother needed a "psych evaluation and possible admission".  Unable to obtain meaningful history from the patient.        Past Medical History:  Diagnosis Date  . Alzheimer disease (HCC)   . Dementia (HCC)   . Hypertension     There are no problems to display for this patient.   History reviewed. No pertinent surgical history.  Prior to Admission medications   Medication Sig Start Date End Date Taking? Authorizing Provider  carbidopa-levodopa (SINEMET IR) 25-100 MG tablet Take 1 tablet by mouth in the morning.    [provider]  carbidopa-levodopa (SINEMET IR) 25-100 MG tablet Take 2 tablets by mouth every evening.    [provider]  clonazePAM (KLONOPIN) 0.5 MG tablet Take 0.5 mg by mouth every evening.    [provider]  clonazePAM (KLONOPIN) 0.5 MG tablet Take 0.25 mg by mouth in the morning.    [provider]  levETIRAcetam (KEPPRA) 500 MG tablet Take 500 mg by mouth 2 (two) times daily.    [provider]  lisinopril (ZESTRIL) 20 MG tablet Take 20 mg by mouth daily.    [provider]  Multiple Vitamins-Minerals  (CERTAVITE SENIOR) TABS Take 1 tablet by mouth daily.    [provider]  Multiple Vitamins-Minerals (ICAPS MV) TABS Take 1 tablet by mouth daily.    [provider]    Allergies Patient has no known allergies.  History reviewed. No pertinent family history.  Social History Social History   Tobacco Use  . Smoking status: Unknown If Ever Smoked  . Smokeless tobacco: Never Used  Substance Use Topics  . Alcohol use: Not Currently  . Drug use: Never    Review of Systems Constitutional: No fever/chills Eyes: No visual changes. ENT: No sore throat. Cardiovascular: Denies chest pain. Respiratory: Denies shortness of breath. Gastrointestinal: No abdominal pain.  No nausea, no vomiting.  No diarrhea.  No constipation. Genitourinary: Negative for dysuria. Musculoskeletal: Negative for neck pain.  Negative for back pain. Integumentary: Negative for rash. Neurological: Negative for headaches, focal weakness or numbness. Psychiatric:  Reported behavior disturbance.   ____________________________________________   PHYSICAL EXAM:  VITAL SIGNS: ED Triage Vitals  Enc Vitals Group     BP 03/08/20 1945 (!) 167/79     Pulse Rate 03/08/20 1945 91     Resp 03/08/20 1945 18     Temp 03/08/20 1945 99.5 F (37.5 C)     Temp Source 03/08/20 1945 Oral     SpO2 03/08/20 1945 100 %     Weight 03/08/20 1949 63.5 kg (140 lb)     Height 03/08/20 1949 1.575 m (5\' 2" )     Head Circumference --      Peak Flow --  Pain Score --      Pain Loc --      Pain Edu? --      Excl. in GC? --     Constitutional: Alert Eyes: Conjunctivae are normal.  Head: Atraumatic. Mouth/Throat: Patient is wearing a mask. Neck: No stridor.  No meningeal signs.   Cardiovascular: Normal rate, regular rhythm. Good peripheral circulation. Grossly normal heart sounds. Respiratory: Normal respiratory effort.  No retractions. Gastrointestinal: Soft and nontender. No distention.  Musculoskeletal:  No lower extremity tenderness nor edema. No gross deformities of extremities. Neurologic:   No gross focal neurologic deficits are appreciated.  Skin:  Skin is warm, dry and intact. Psychiatric: Pleasant mood and affect____________________________________________   LABS (all labs ordered are listed, but only abnormal results are displayed)  Labs Reviewed  COMPREHENSIVE METABOLIC PANEL - Abnormal; Notable for the following components:      Result Value   Potassium 3.2 (*)    Glucose, Bld 116 (*)    All other components within normal limits  URINE DRUG SCREEN, QUALITATIVE ([facility] ONLY) - Abnormal; Notable for the following components:   Barbiturates, Ur Screen POSITIVE (*)    Benzodiazepine, Ur Scrn POSITIVE (*)    All other components within normal limits  URINALYSIS, COMPLETE (UACMP) WITH MICROSCOPIC - Abnormal; Notable for the following components:   Color, Urine YELLOW (*)    APPearance TURBID (*)    Glucose, UA 50 (*)    Ketones, ur 20 (*)    Protein, ur 30 (*)    Leukocytes,Ua TRACE (*)    All other components within normal limits  ETHANOL  CBC       Procedures   ____________________________________________   INITIAL IMPRESSION / MDM / ASSESSMENT AND PLAN / ED COURSE  As part of my medical decision making, I reviewed the following data within the electronic MEDICAL RECORD NUMBER  86 year old female presented with above-stated history and physical exam awaiting psychiatry consultation and disposition.  ____________________________________________  FINAL CLINICAL IMPRESSION(S) / ED DIAGNOSES  Final diagnoses:  Alzheimer's dementia with behavioral disturbance, unspecified timing of dementia onset (HCC)     MEDICATIONS GIVEN DURING THIS VISIT:  Medications - No data to display   ED Discharge Orders    None      *Please note:  [name] was evaluated in Emergency Department on 03/09/2020 for the symptoms described in the history of present illness. She was  evaluated in the context of the global COVID-19 pandemic, which necessitated consideration that the patient might be at risk for infection with the SARS-CoV-2 virus that causes COVID-19. Institutional protocols and algorithms that pertain to the evaluation of patients at risk for COVID-19 are in a state of rapid change based on information released by regulatory bodies including the CDC and federal and state organizations. These policies and algorithms were followed during the patient's care in the ED.  Some ED evaluations and interventions may be delayed as a result of limited staffing during and after the pandemic.*  Note:  This document was prepared using Dragon voice recognition software and may include unintentional dictation errors.   Darci Current, MD 03/09/20 [phone]

## 2020-03-09 NOTE — Consult Note (Signed)
[facility] Face-to-Face Psychiatry Consult   Reason for Consult:  Behavioral disturbance due to Dementia, history of Parkinson's   Referring Physician:   ER MD    Patient Identification: Katherine Glover  86 year old Caucasian female here for behavioral disturbance and mood swings from Alzheimer's dementia   MRN:  [id number]   Principal Diagnosis:  Same as above   Worsening behaviors at [facility] --only placed there two weeks ago from her original home.  Now with many behaviors NH cannot handle.   Diagnosis:  Active Problems:   * No active hospital problems. *    Total Time spent with patient:  Up to one hour including meeting with daughter    Subjective:   Katherine Glover is a 86 y.o. female patient admitted with worsening behavioral, mood and psychotic symptoms from Alzheimer's dementia   She is having increasing dysphoria and anxiety as her environment of many years has changed recently \  Mood swings, ups and downs, lability, highs and lows ---speedy actions, sudden crying spells.  Lack of sleep increasing depression ---Patient also has been throwing objects reportedly in the NH locked care.   Transferred here for further mgt with Psych meds after med clearance ---  She has been voicing suicidal ideation hints and plans   "  I should just kill myself"---  NH thus will not take her back without evidence of improvement or after Katherine Glover Psych admit  Or both.    Remains on IVC    HPI:   See above  Main history is from Daughter ----Ms. Katherine Glover     Past Psychiatric History:  She has no past history of Psych but has kids and grand kids with bipolar disorder.  No previous psych med treatment from primary care as well.   She has some fall history as reported by daughter ----    Risk to Self:  Risk of Falls in the NH fell twice ---- Risk to Others:  She is getting more disinhibited and throws objects  Prior Inpatient Therapy:  none  Prior Outpatient Therapy:  none  Past Medical History:   Past Medical History:  Diagnosis Date  . Alzheimer disease (HCC)   . Dementia (HCC)   . Hypertension    History reviewed. No pertinent surgical history. Family History: History reviewed. No pertinent family history. Family Psychiatric  History:  Already discussed  Social History:  Social History   Substance and Sexual Activity  Alcohol Use Not Currently     Social History   Substance and Sexual Activity  Drug Use Never    Social History   Socioeconomic History  . Marital status: Unknown    Spouse name: Not on file  . Number of children: Not on file  . Years of education: Not on file  . Highest education level: Not on file  Occupational History  . Not on file  Tobacco Use  . Smoking status: Unknown If Ever Smoked  . Smokeless tobacco: Never Used  Substance and Sexual Activity  . Alcohol use: Not Currently  . Drug use: Never  . Sexual activity: Not on file  Other Topics Concern  . Not on file  Social History Narrative  . Not on file   Social Determinants of Health   Financial Resource Strain:   . Difficulty of Paying Living Expenses: Not on file  Food Insecurity:   . Worried About Programme researcher, broadcasting/film/video in the Last Year: Not on file  . Ran Out of Food in the  Last Year: Not on file  Transportation Needs:   . Lack of Transportation (Medical): Not on file  . Lack of Transportation (Non-Medical): Not on file  Physical Activity:   . Days of Exercise per Week: Not on file  . Minutes of Exercise per Session: Not on file  Stress:   . Feeling of Stress : Not on file  Social Connections:   . Frequency of Communication with Friends and Family: Not on file  . Frequency of Social Gatherings with Friends and Family: Not on file  . Attends Religious Services: Not on file  . Active Member of Clubs or Organizations: Not on file  . Attends BankerClub or Organization Meetings: Not on file  . Marital Status: Not on file   Additional Social History:---    Allergies:  No Known  Allergies  Labs:  Results for orders placed or performed during the hospital encounter of 03/09/20 (from the past 48 hour(s))  Comprehensive metabolic panel     Status: Abnormal   Collection Time: 03/08/20  7:54 PM  Result Value Ref Range   Sodium 139 135 - 145 mmol/L   Potassium 3.2 (L) 3.5 - 5.1 mmol/L   Chloride 102 98 - 111 mmol/L   CO2 28 22 - 32 mmol/L   Glucose, Bld 116 (H) 70 - 99 mg/dL    Comment: Glucose reference range applies only to samples taken after fasting for at least 8 hours.   BUN 16 8 - 23 mg/dL   Creatinine, Ser 1.610.53 0.44 - 1.00 mg/dL   Calcium 9.3 8.9 - 09.610.3 mg/dL   Total Protein 7.0 6.5 - 8.1 g/dL   Albumin 4.0 3.5 - 5.0 g/dL   AST 31 15 - 41 U/L   ALT 23 0 - 44 U/L   Alkaline Phosphatase 83 38 - 126 U/L   Total Bilirubin 1.1 0.3 - 1.2 mg/dL   GFR calc non Af Amer >60 >60 mL/min   GFR calc Af Amer >60 >60 mL/min   Anion gap 9 5 - 15    Comment: Performed at [facility], [address] [place], [place] [address]  Ethanol     Status: None   Collection Time: 03/08/20  7:54 PM  Result Value Ref Range   Alcohol, Ethyl (B) <10 <10 mg/dL    Comment: (NOTE) Lowest detectable limit for serum alcohol is 10 mg/dL.  For medical purposes only. Performed at [facility], [address] [place], [place] [address]   cbc     Status: None   Collection Time: 03/08/20  7:54 PM  Result Value Ref Range   WBC 7.2 4.0 - 10.5 K/uL   RBC 4.11 3.87 - 5.11 MIL/uL   Hemoglobin 12.8 12.0 - 15.0 g/dL   HCT 14.738.2 36 - 46 %   MCV 92.9 80.0 - 100.0 fL   MCH 31.1 26.0 - 34.0 pg   MCHC 33.5 30.0 - 36.0 g/dL   RDW 82.912.6 56.211.5 - 13.015.5 %   Platelets 190 150 - 400 K/uL   nRBC 0.0 0.0 - 0.2 %    Comment: Performed at [facility], [address] [place], [place] [address]  Urine Drug Screen, Qualitative     Status: Abnormal   Collection Time: 03/08/20  8:11 PM  Result Value Ref Range   Tricyclic, Ur Screen NONE DETECTED NONE DETECTED   Amphetamines,  Ur Screen NONE DETECTED NONE DETECTED   MDMA (Ecstasy)Ur Screen NONE DETECTED NONE DETECTED   Cocaine Metabolite,Ur  [place] NONE DETECTED NONE DETECTED   Opiate, Ur Screen NONE DETECTED NONE DETECTED   Phencyclidine (PCP) Ur S NONE DETECTED NONE DETECTED   Cannabinoid 50 Ng, Ur [place] NONE DETECTED NONE DETECTED   Barbiturates, Ur Screen POSITIVE (A) NONE DETECTED   Benzodiazepine, Ur Scrn POSITIVE (A) NONE DETECTED   Methadone Scn, Ur NONE DETECTED NONE DETECTED    Comment: (NOTE) Tricyclics + metabolites, urine    Cutoff 1000 ng/mL Amphetamines + metabolites, urine  Cutoff 1000 ng/mL MDMA (Ecstasy), urine              Cutoff 500 ng/mL Cocaine Metabolite, urine          Cutoff 300 ng/mL Opiate + metabolites, urine        Cutoff 300 ng/mL Phencyclidine (PCP), urine         Cutoff 25 ng/mL Cannabinoid, urine                 Cutoff 50 ng/mL Barbiturates + metabolites, urine  Cutoff 200 ng/mL Benzodiazepine, urine              Cutoff 200 ng/mL Methadone, urine                   Cutoff 300 ng/mL  The urine drug screen provides only a preliminary, unconfirmed analytical test result and should not be used for non-medical purposes. Clinical consideration and professional judgment should be applied to any positive drug screen result due to possible interfering substances. A more specific alternate chemical method must be used in order to obtain a confirmed analytical result. Gas chromatography / mass spectrometry (GC/MS) is the preferred confirm atory method. Performed at [facility], [address] [place], [place] [address]   Urinalysis, Complete w Microscopic     Status: Abnormal   Collection Time: 03/08/20  8:11 PM  Result Value Ref Range   Color, Urine YELLOW (A) YELLOW   APPearance TURBID (A) CLEAR   Specific Gravity, Urine 1.030 1.005 - 1.030   pH 5.0 5.0 - 8.0   Glucose, UA 50 (A) NEGATIVE mg/dL   Hgb urine dipstick NEGATIVE NEGATIVE   Bilirubin Urine NEGATIVE NEGATIVE    Ketones, ur 20 (A) NEGATIVE mg/dL   Protein, ur 30 (A) NEGATIVE mg/dL   Nitrite NEGATIVE NEGATIVE   Leukocytes,Ua TRACE (A) NEGATIVE   WBC, UA NONE SEEN 0 - 5 WBC/hpf   Bacteria, UA NONE SEEN NONE SEEN   Squamous Epithelial / LPF NONE SEEN 0 - 5   Mucus PRESENT    Ca Oxalate Crys, UA PRESENT     Comment: Performed at [facility], [address], [place], [place] [address]    Current Facility-Administered Medications  Medication Dose Route Frequency Provider Last Rate Last Admin  . carbidopa-levodopa (SINEMET IR) 25-100 MG per tablet immediate release 1 tablet  1 tablet Oral q AM Gilles Chiquito, MD   1 tablet at 03/09/20 0955  . carbidopa-levodopa (SINEMET IR) 25-100 MG per tablet immediate release 2 tablet  2 tablet Oral QPM Gilles Chiquito, MD      . clonazePAM Scarlette Calico) tablet 0.5 mg  0.5 mg Oral BID Roselind Messier, MD      . haloperidol (HALDOL) tablet 0.25 mg  0.25 mg Oral TID Roselind Messier, MD      . levETIRAcetam (KEPPRA) tablet 500 mg  500 mg Oral BID Gilles Chiquito, MD   500 mg at 03/09/20 0955  . lisinopril (ZESTRIL) tablet 20 mg  20 mg Oral Daily Gilles Chiquito, MD   20 mg at 03/09/20 0955  . OLANZapine (ZYPREXA) tablet 2.5 mg  2.5 mg Oral QHS Roselind Messier, MD       Current Outpatient Medications  Medication Sig Dispense Refill  . carbidopa-levodopa (SINEMET IR) 25-100 MG tablet Take 1 tablet by mouth in the morning.    . carbidopa-levodopa (SINEMET IR) 25-100 MG tablet Take 2 tablets by mouth every evening.    . clonazePAM (KLONOPIN) 0.5 MG tablet Take 0.5 mg by mouth every evening.    . clonazePAM (KLONOPIN) 0.5 MG tablet Take 0.25 mg by mouth in the morning.    . levETIRAcetam (KEPPRA) 500 MG tablet Take 500 mg by mouth 2 (two) times daily.    Marland Kitchen lisinopril (ZESTRIL) 20 MG tablet Take 20 mg by mouth daily.    . Multiple Vitamins-Minerals (CERTAVITE SENIOR) TABS Take 1 tablet by mouth daily.    . Multiple Vitamins-Minerals (ICAPS MV) TABS Take 1  tablet by mouth daily.      Musculoskeletal: Strength & Muscle Tone: limited  Gait & Station: limited history of falls  Patient leans:  Not clear   Psychiatric Specialty Exam: Physical Exam  Review of Systems  Blood pressure (!) 189/106, pulse 86, temperature 97.6 F (36.4 C), temperature source Axillary, resp. rate 18, height 5\' 2"  (1.575 m), weight 63.5 kg, SpO2 96 %.Body mass index is 25.61 kg/m.    Mental Status  Dysphoric, distraught confused Caucasian female Oriented time one Consciousness not clouded or fluctuant Concentration and attention poor Exam limited ---- Speech --somewhat loud pressured Rapport eye contact poor Thought process and content --delusional, paranoid fearful  Judgment insight reliability all poor Intelligence and fund of knowledge all declining SI --voices active SI for now Hi none Abstraction -----poor No tics tremors tics Memory cannot assess Appearance  Thin haggard frail                                                        Sleep limited Aims not done Assets caring family  ADL's ---in locked NH care Cognition poor Psychomotor --up and down Recall poor Handedness not known Akathisia none Language   English        Treatment Plan Summary: Dementia with behavioral disturbance with psychosis mood swings and anxiety   Meds added and changed including addition of low dose haldol, Zyprexa, klonopin and Nortriptyline at night  Awaits [facility] bed at this time   Spoke with daughter at length to discuss treatment, history, plans and overall Psychoeducational and offer empathy and support    Disposition:    , MD 03/09/2020 10:29 AM

## 2020-03-09 NOTE — ED Notes (Addendum)
Pt gien bed bath by this tech and Gaffer. Pt changed into new burgundy scrubs. Pt also was holding her urine so we walked her to the toilet and she was able to void.

## 2020-03-09 NOTE — ED Notes (Signed)
VOl, pend [employer]

## 2020-03-09 NOTE — ED Notes (Signed)
Pt daughter communicated over phone with this nurse that pt was sent to ED due to today, laying in floor and refusing to get up, pt also attempted to throw lamp through window in an attempt to escape. Pt in recent past reported suicidal thoughts per daughter and was seen and treated. Pt daughter states that [facility] informed her to go and have IVC taken out on pt and that pt cannot return to [facility] until after she has been treated for x3 days since she arrived. Pts daughter states that [facility] informed her that pt medications may need to be adjusted. Pt has had recently caused scenes at [facility] stating and yelling that staff is hurting her and her daughter assalted her, where daughter reports are not true. MD to be notified.

## 2020-03-09 NOTE — ED Notes (Signed)
Attempted to mix medications in grape juice and sat at bedside. Pt refusing drink at this time. Will continue to monitor if pt drinks or not.

## 2020-03-09 NOTE — ED Notes (Signed)
Pt attempting to get out of bed, pt speaking about getting information and going to placed and her son drowning. This nurse asks MD for meds to assist with pt to relax, Dr. Katrinka Blazing orders pt home meds.

## 2020-03-09 NOTE — BH Assessment (Signed)
  Referral checks for Psychiatric Hospitalization:   Katherine Glover ([phone]- [phone]), 8:14 PM No answer   [facility] [phone] -[phone]) [phone] 8:16 PM Per [name] denied due to her being a fall risk.   [name] ([phone]---[phone]), 8:17 PM No answer   [name] [phone], [phone], [phone] or [phone]), 8:25 PM Left  HIPPA compliant message.    [facility] [phone]), 10:46 PM Per [name], the facility is at capacity.   [facility] [phone] or [phone]) 10:48 PM Per [name], pt is still under review.    [facility] [phone] or [phone]), 10:53 PM Per [name], pt hadn't been reviewed. [name] agreed to follow up once pt is reviewed.    [name] [phone]). 10:54 PM Left a message for a return call.

## 2020-03-09 NOTE — ED Notes (Signed)
Meds mixed in icecream

## 2020-03-09 NOTE — ED Notes (Signed)
Pt trying to exit her room.  This Clinical research associate, NT and security assisted pt back into bed.

## 2020-03-09 NOTE — ED Notes (Signed)
Pt tearful in hallway, pt speaking about a son who just drowned and how her blanket needs to be between her head and her pillow. This nurse attempts to adjust pt as she requests with no success. Pt then asks nurse to take other blankets off of her and hang above her head, nurse reorients pt at this time and pt back sitting in bed comfortably with eyes closed

## 2020-03-09 NOTE — ED Notes (Signed)
Pt threw grape juice at staff. Meds in grape juice. Meds charted as refused.

## 2020-03-09 NOTE — BH Assessment (Signed)
Referral information for Psychiatric Hospitalization faxed to;   . [facility] ([phone]-or- [phone]),   . [facility] [phone] -or- [phone]) [phone]  . [name] ([phone]---[phone]),  . [facility] ([phone], [phone], [phone] or [phone]),   . [facility] ([phone]),   . [facility] ([phone] or [phone])  . [facility] ([phone] or [phone]),   . [facility] ([phone]). 

## 2020-03-09 NOTE — ED Provider Notes (Signed)
-----------------------------------------   11:10 PM on 03/09/2020 -----------------------------------------  The patient has been placed in psychiatric observation due to the need to provide a safe environment for the patient while obtaining psychiatric consultation and evaluation, as well as ongoing medical and medication management to treat the patient's condition.  The patient has not been placed under full IVC at this time.    Loleta Rose, MD 03/09/20 2310

## 2020-03-09 NOTE — ED Notes (Signed)
Pt took meds in chocolate icecream. Pt threw about half of her keppra on the ground. Pt got the rest of her medications.

## 2020-03-09 NOTE — ED Notes (Signed)
Pt strongly refused vitals. Officer and two RNs and one tech used to hold pt down to obtain VS.

## 2020-03-10 DIAGNOSIS — G309 Alzheimer's disease, unspecified: Secondary | ICD-10-CM | POA: Diagnosis not present

## 2020-03-10 MED ORDER — ACETAMINOPHEN 325 MG PO TABS
650.0000 mg | ORAL_TABLET | Freq: Once | ORAL | Status: AC
  Administered 2020-03-10: 650 mg via ORAL
  Filled 2020-03-10: qty 2

## 2020-03-10 NOTE — ED Provider Notes (Signed)
Emergency Medicine Observation Re-evaluation Note  Missey Hasley is a 86 y.o. female, seen on rounds today.  Pt initially presented to the ED for complaints of Psychiatric Evaluation Currently, the patient is resting.  Physical Exam  BP 116/63 (BP Location: Left Arm)   Pulse 94   Temp 97.6 F (36.4 C) (Oral)   Resp 17   Ht 1.575 m (5\' 2" )   Wt 63.5 kg   SpO2 97%   BMI 25.61 kg/m  Physical Exam Gen:  No acute distress Resp:  Breathing easily and comfortably, no accessory muscle usage Neuro:  Moving all four extremities, no gross focal neuro deficits Psych:  Resting currently, calm and cooperative when awake  ED Course / MDM  EKG:    I have reviewed the labs performed to date as well as medications administered while in observation.  Recent changes in the last 24 hours include evaluation by psychiatry team (Dr. ).  Plan  Current plan is for geropsych bed placement at another facility. Patient is not under full IVC at this time.   Smith Robert, MD 03/10/20 [phone]

## 2020-03-10 NOTE — BH Assessment (Signed)
Referral checks for Psychiatric Hospitalization:   [facility] Gl[facility] [phone]), 6:39 AM Per Tamela Oddi there is no intake staff on weekends. Provided access center contact info [phone]. Morrie Sheldon of pt access requested a refax. Task completed at 6:47 AM. [name] [phone], [phone], [phone] or [phone]), Unable to reach anyone.   [facility] ([phone]), No beds   [facility] ([phone] or [phone]), 8:50 PM Per Verdon Cummins, pt is still under review.    Sandre Kitty [phone] or [phone]), Unable to reach anyone   [name] ([phone]), Intake person wasn't available.

## 2020-03-10 NOTE — BH Assessment (Signed)
Referral checks for Psychiatric Hospitalization:   [facility] ([phone]-or- [phone]), Denied due to medical.   [facility] [phone] -or- [phone]) denied due to fall risk.   Katherine Glover [phone]), unable to reach anyone   [facility] Glover [phone], [phone], [phone] or [phone]), Unable to reach anyone.   [facility] (Katherine Glover-[phone]), No beds   [facility] (Katherine [phone] or [phone]), Pending review   Katherine Glover [phone] or [phone]), Unable to reach anyone   [name] (Katherine Glover-[phone]), Intake person wasn't available.

## 2020-03-11 DIAGNOSIS — G309 Alzheimer's disease, unspecified: Secondary | ICD-10-CM | POA: Diagnosis not present

## 2020-03-11 MED ORDER — NORTRIPTYLINE HCL 10 MG PO CAPS
20.0000 mg | ORAL_CAPSULE | Freq: Every day | ORAL | Status: DC
  Administered 2020-03-11: 20 mg via ORAL
  Filled 2020-03-11: qty 2

## 2020-03-11 MED ORDER — OLANZAPINE 5 MG PO TABS
2.5000 mg | ORAL_TABLET | Freq: Every day | ORAL | Status: DC
  Administered 2020-03-11: 2.5 mg via ORAL

## 2020-03-11 NOTE — ED Notes (Signed)
Report to include Situation, Background, Assessment, and Recommendations received from [facility]. Patient alert, disoriented, warm and dry, in no acute distress. UTA SI, HI, AVH and pain. Patient made aware of Q15 minute rounds and Psychologist, counselling presence for their safety. Patient instructed to come to me with needs or concerns.

## 2020-03-11 NOTE — ED Notes (Signed)
Patient becoming anxious and teary. Requesting to talk to her husband. Patient asked to call her daughter Marylu Lund. Daughter called and placed on phone.

## 2020-03-11 NOTE — ED Notes (Signed)
Hourly rounding reveals patient in room. No complaints, stable, in no acute distress. Q15 minute rounds and monitoring via Rover and Officer to continue.   

## 2020-03-11 NOTE — BH Assessment (Addendum)
Referral Check:   [name] [phone], [phone] or [phone]) Marylu Lund reports plans for the charge RN to follow up when in office after 10am   [facility] [phone]), Misty Stanley reports no bed availability and to follow up tomorrow after [employer] [phone] or [phone]), Wonda Amis reports denied due to fall spells    [place] [phone] or [phone]),[facility] reports under review    [name] [phone]), left voicemail

## 2020-03-11 NOTE — ED Provider Notes (Signed)
Emergency Medicine Observation Re-evaluation Note  Katherine Glover is a 86 y.o. female, seen on rounds today.  Pt initially presented to the ED for complaints of Psychiatric Evaluation Currently, the patient is stable, resting comfortably in no distress.  Physical Exam  BP 120/65 (BP Location: Right Arm)   Pulse 88   Temp 97.7 F (36.5 C) (Oral)   Resp 15   Ht 5\' 2"  (1.575 m)   Wt 63.5 kg   SpO2 100%   BMI 25.61 kg/m  Physical Exam  Gen: No acute distress Resp: Breathing easily and comfortably, no accessory muscle usage Neuro: Moving all four extremities, no gross focal neuro deficits Psych: Resting currently, calm and cooperative when awake  ED Course / MDM  EKG:    I have reviewed the labs performed to date as well as medications administered while in observation.  Recent changes in the last 24 hours include none.  Plan  Current plan is for geripsych bed placement Patient is not under full IVC at this time.   , MD 03/11/20 [phone]

## 2020-03-11 NOTE — ED Notes (Signed)
Patient assisted to bathroom. Patient had large bowel movement

## 2020-03-11 NOTE — BH Assessment (Addendum)
TTS faxed over requested paperwork to [facility]), vitals, chest xray and IVC  Pt family support Marylu Lund [phone]) was contacted and made aware of IVC and was also made aware of current disposition.

## 2020-03-11 NOTE — ED Notes (Signed)
Pt refused sandwich tray.  Asked her to let me know of any needs

## 2020-03-12 DIAGNOSIS — G309 Alzheimer's disease, unspecified: Secondary | ICD-10-CM | POA: Diagnosis not present

## 2020-03-12 NOTE — ED Notes (Signed)
Hourly rounding reveals patient in room. No complaints, stable, in no acute distress. Q15 minute rounds and monitoring via Rover and Officer to continue.   

## 2020-03-12 NOTE — BH Assessment (Signed)
PATIENT BED IS READY NOW  Patient has been accepted to [facility].  Patient assigned to: Bed 419 Accepting physician is Dr. Joseph Art  Call report to [phone] Representative was [facility]   ER Staff is aware of it:  Nitchia ER Secretary  Vicente Males, ER MD  Amy Patient's Nurse     Patient's Family/Support System Selena Lesser (Daughter), [phone]) have been updated as well.

## 2020-03-12 NOTE — ED Notes (Signed)
IVC pending placement 

## 2020-03-12 NOTE — ED Provider Notes (Signed)
Emergency Medicine Observation Re-evaluation Note  Katherine Glover is a 86 y.o. female, seen on rounds today.  Pt initially presented to the ED for complaints of Psychiatric Evaluation Currently, the patient is resting.  Physical Exam  BP 132/68 (BP Location: Right Arm)   Pulse 78   Temp 97.9 F (36.6 C) (Oral)   Resp 20   Ht 1.575 m (5\' 2" )   Wt 63.5 kg   SpO2 100%   BMI 25.61 kg/m  Physical Exam Gen:  No acute distress Resp:  Breathing easily and comfortably, no accessory muscle usage Neuro:  Moving all four extremities, no gross focal neuro deficits Psych:  Resting currently, calm and cooperative when awake  ED Course / MDM  EKG:    I have reviewed the labs performed to date as well as medications administered while in observation.  Recent changes in the last 24 hours include no significant clinical changes.  Plan  Current plan is for geropsych placement. Patient is not under full IVC at this time.   , MD 03/12/20 [phone]

## 2021-01-26 IMAGING — CT CT CERVICAL SPINE W/O CM
2 series · 13 of 27 positions shown, 16 images · non-contrast
Comparison: Earlier CT head and earlier CT cervical spine from
03/03/2020 at [DATE] a.m.

CLINICAL DATA: Unwitnessed fall, patient found supine in bathroom.
Apparently this was a different fall from the fall earlier this
morning.

EXAM:
CT HEAD WITHOUT CONTRAST
CT CERVICAL SPINE WITHOUT CONTRAST
TECHNIQUE: Multidetector CT imaging of the head and cervical spine was
performed following the standard protocol without intravenous
contrast. Multiplanar CT image reconstructions of the cervical spine
were also generated.

[Series 3: c spine soft · axial · 0.42mm/px · z∈[-248,-130]mm · 8 of 71 slices shown, 10 images]
[im 6/71  soft-tissue]
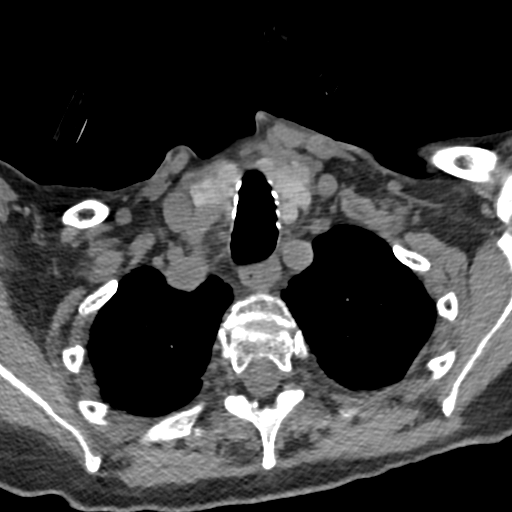
[im 6/71  bone]
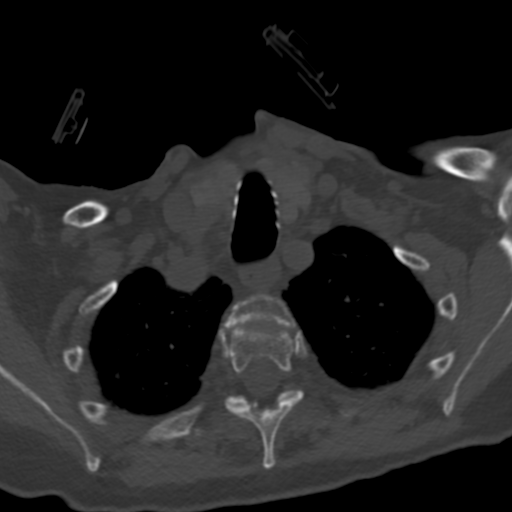
[im 17/71  bone]
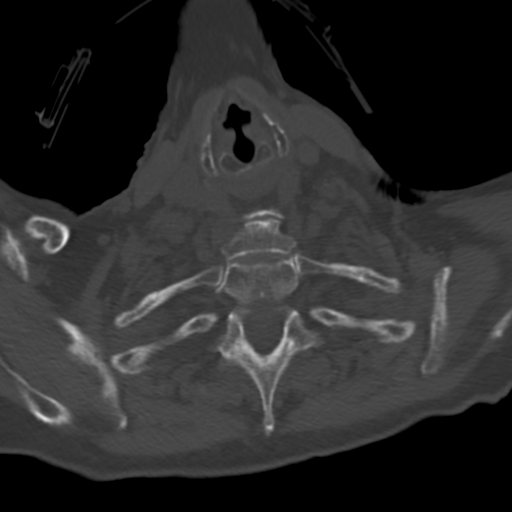
[im 22/71  bone]
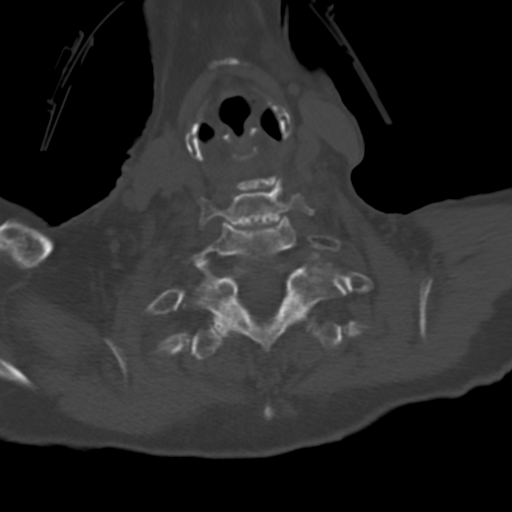
[im 33/71  bone]
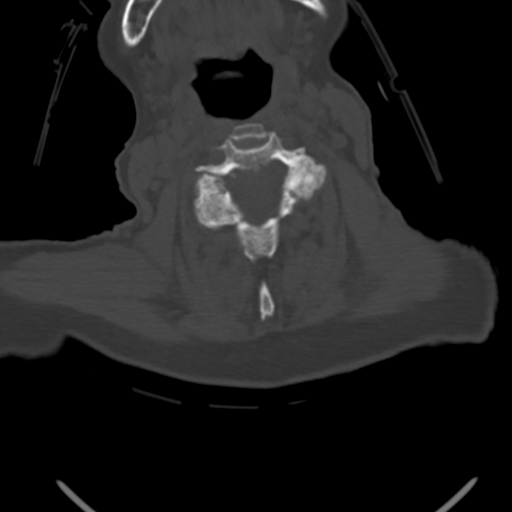
[im 38/71  soft-tissue]
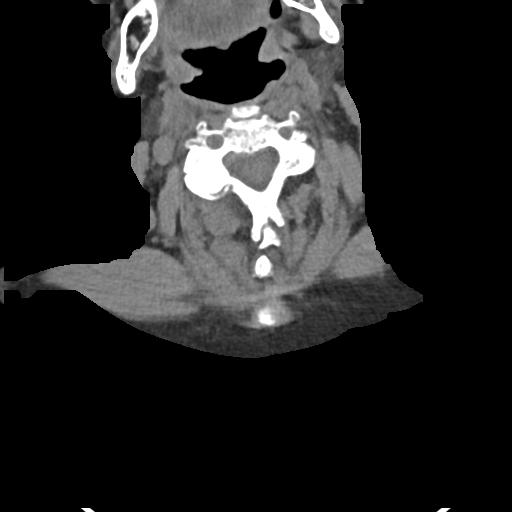
[im 38/71  bone]
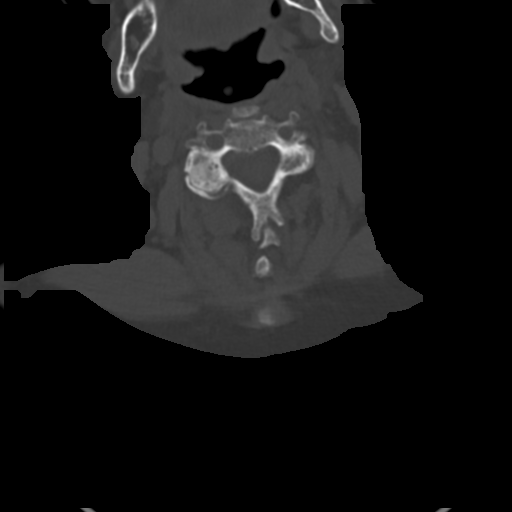
[im 49/71  bone]
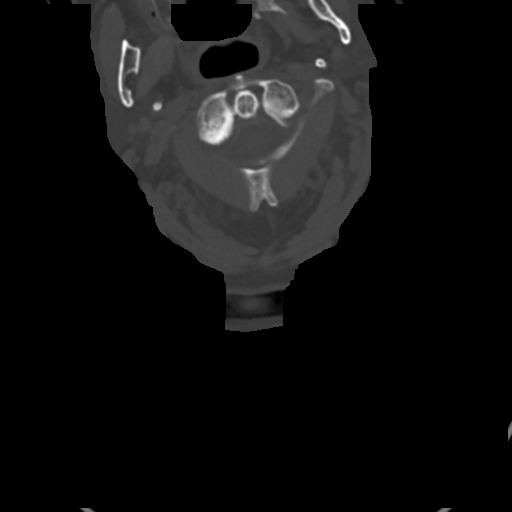
[im 54/71  bone]
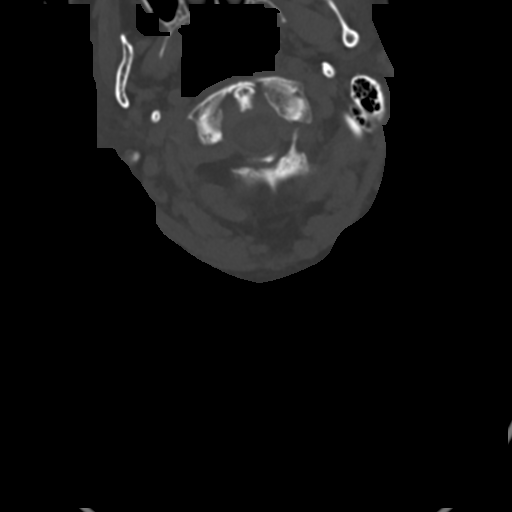
[im 65/71  bone]
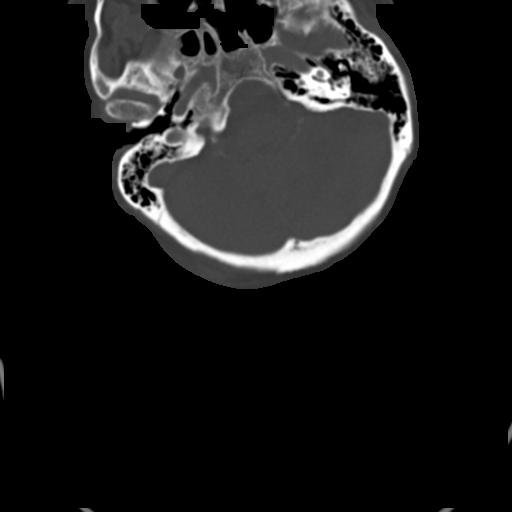

[Series 4: sagittal bone · sagittal · 0.30mm/px · 5 of 58 slices shown, 6 images]
[im 20/58  bone]
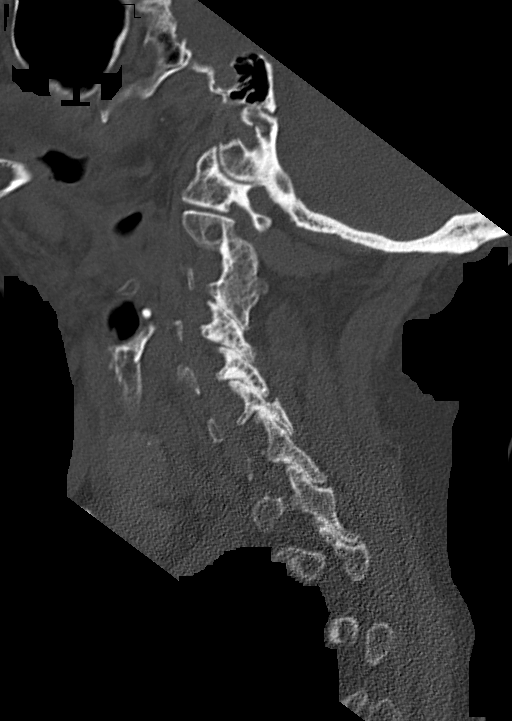
[im 24/58  bone]
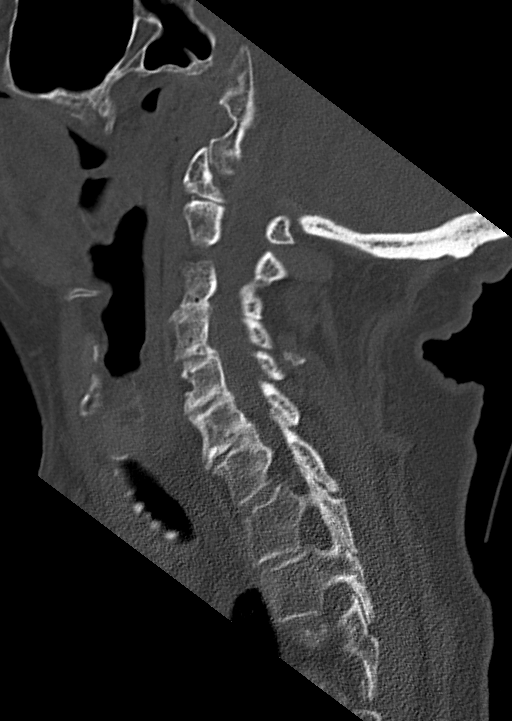
[im 29/58  soft-tissue]
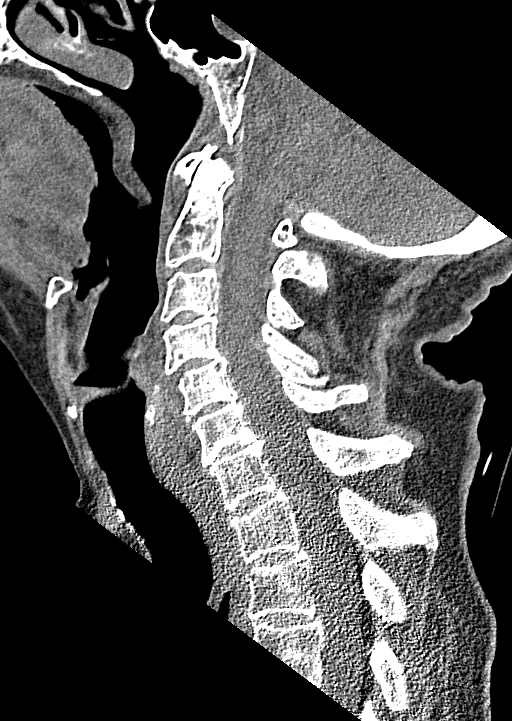
[im 29/58  bone]
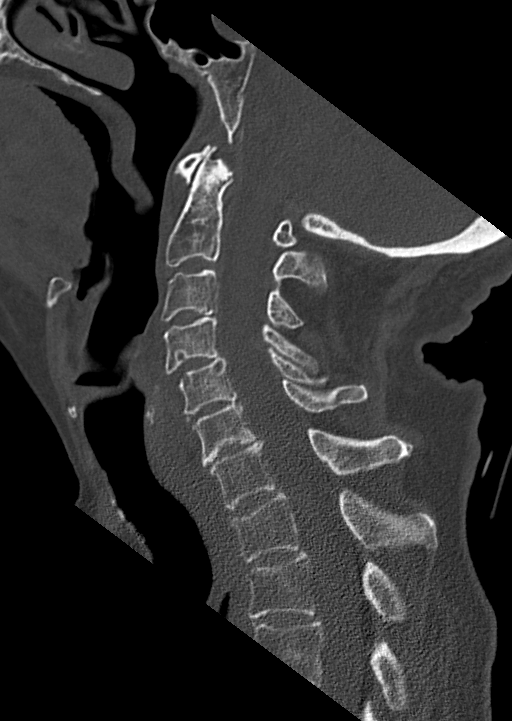
[im 34/58  bone]
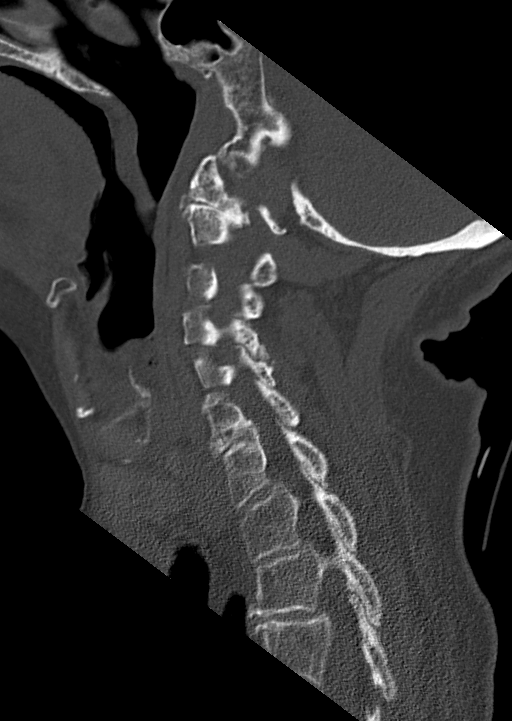
[im 39/58  bone]
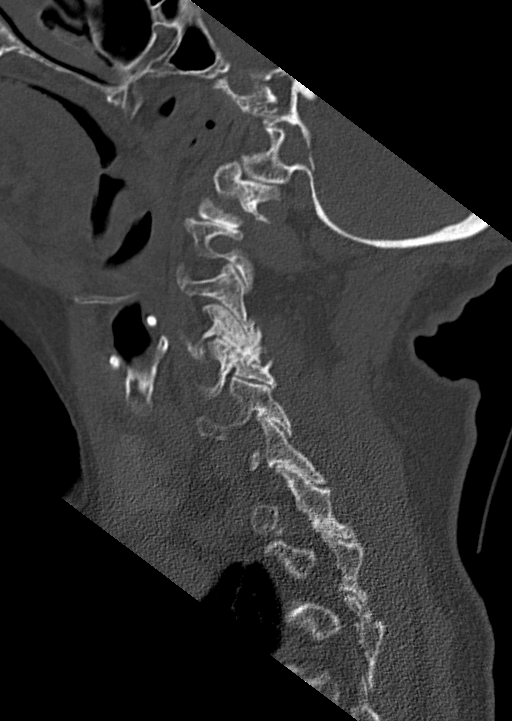

[13 of 27 positions shown; findings below may reference images not displayed]

FINDINGS: CT HEAD FINDINGS

Brain: The brainstem, cerebellum, cerebral peduncles, thalami, basal
ganglia, basilar cisterns, and ventricular system appear within
normal limits. No intracranial hemorrhage, mass lesion, or acute
CVA.

Vascular: Minimal atherosclerotic calcification of the cavernous
carotid arteries. Otherwise unremarkable.

Skull: Mild hyperostosis frontalis interna.

Sinuses/Orbits: Mild chronic ethmoid sinusitis.

Other: Stable right occipitoparietal scalp hematoma.

CT CERVICAL SPINE FINDINGS

Alignment: Degenerative mild grade 1 anterolisthesis at C4-5 and
C7-T1 as well as T1-2 and T2-3, not appreciably changed.

Skull base and vertebrae: Fused left facet joints and mild
interspinous fusion at C2-3, chronic. Degenerative arthropathy at
C1-2 anteriorly and on the right. No cervical spine fracture or
acute subluxation is identified. Minimal anterior wedging at T1,
probably chronic, and unchanged. No significant prevertebral soft
tissue swelling.

Soft tissues and spinal canal: Mild nodularity of the thyroid gland
measuring up to 1.4 cm in diameter. Not clinically significant; no
follow-up imaging recommended (ref: [HOSPITAL]. [DATE]): 143-50).Mild bilateral common carotid atherosclerotic
calcification.

Disc levels: Cervical spondylosis and uncinate spurring but without
overt foraminal impingement.

Upper chest: Mild biapical pleuroparenchymal scarring.

Other: No supplemental non-categorized findings.
IMPRESSION: 1. No acute intracranial findings or acute cervical spine findings.
2. Stable right occipitoparietal scalp hematoma.
3. Mild chronic ethmoid sinusitis.
4. Cervical spondylosis and uncinate spurring.

## 2021-01-26 IMAGING — CT CT HEAD W/O CM
3 of 4 series · 12 of 47 positions shown, 14 images · non-contrast
Comparison: Earlier CT head and earlier CT cervical spine from
03/03/2020 at [DATE] a.m.

CLINICAL DATA: Unwitnessed fall, patient found supine in bathroom.
Apparently this was a different fall from the fall earlier this
morning.

EXAM:
CT HEAD WITHOUT CONTRAST
CT CERVICAL SPINE WITHOUT CONTRAST
TECHNIQUE: Multidetector CT imaging of the head and cervical spine was
performed following the standard protocol without intravenous
contrast. Multiplanar CT image reconstructions of the cervical spine
were also generated.

[Series 3: ax head wo · axial · 0.31mm/px · z∈[-82,+12]mm · 6 of 29 slices shown, 8 images]
[im 5/29  brain]
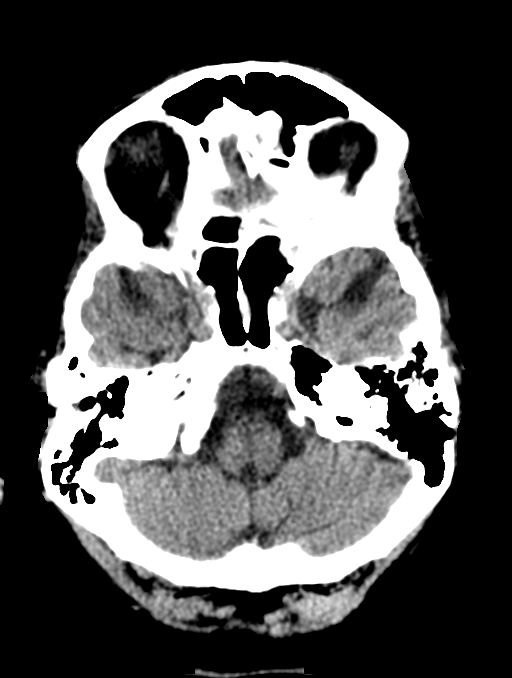
[im 5/29  bone]
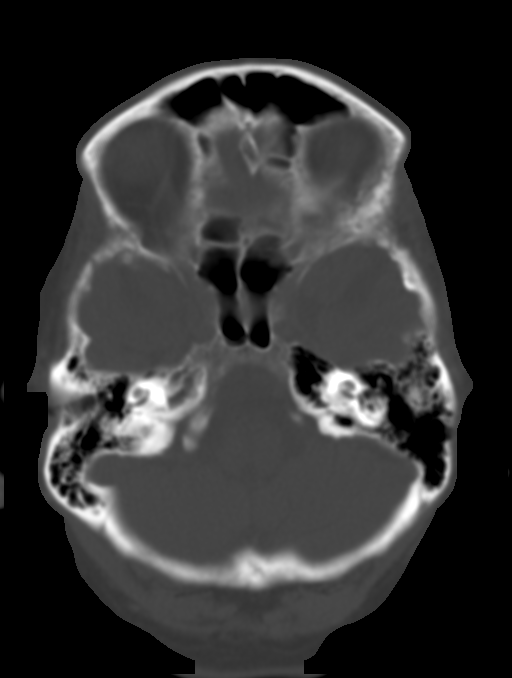
[im 9/29  brain]
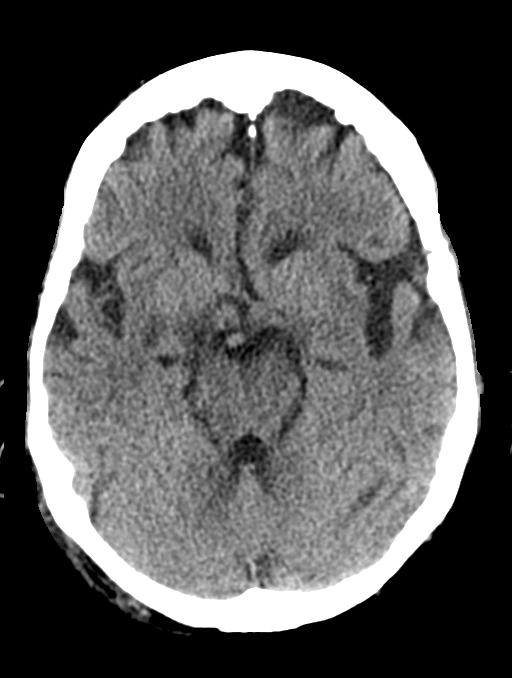
[im 13/29  brain]
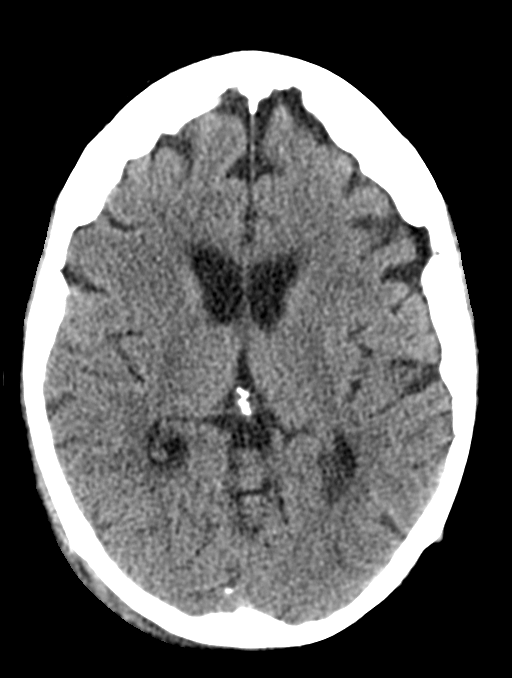
[im 17/29  brain]
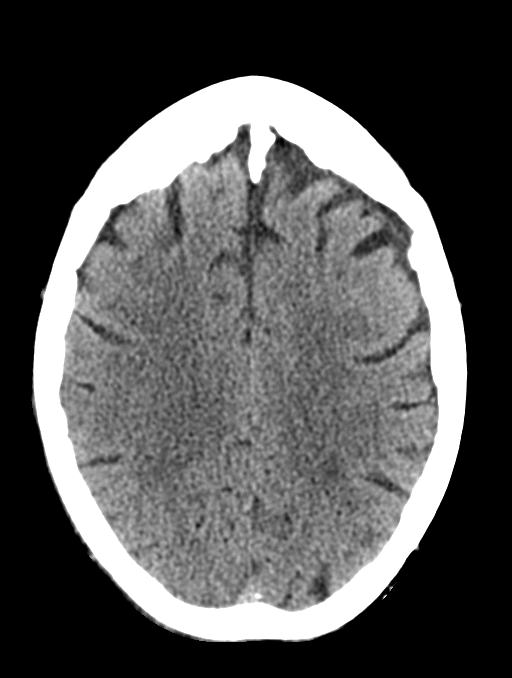
[im 21/29  brain]
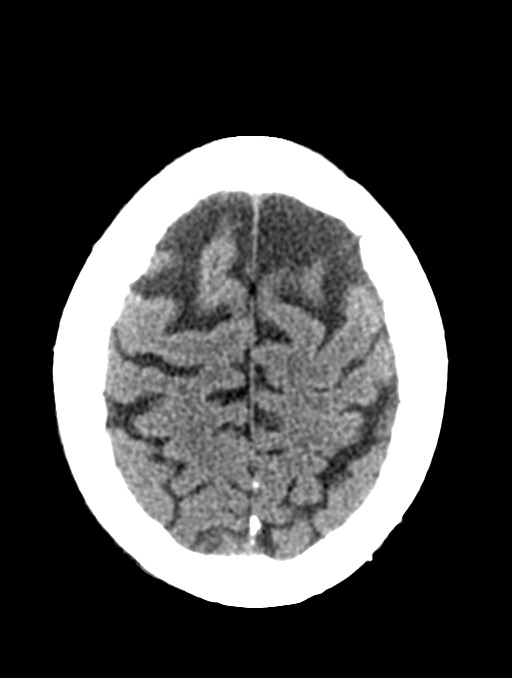
[im 21/29  bone]
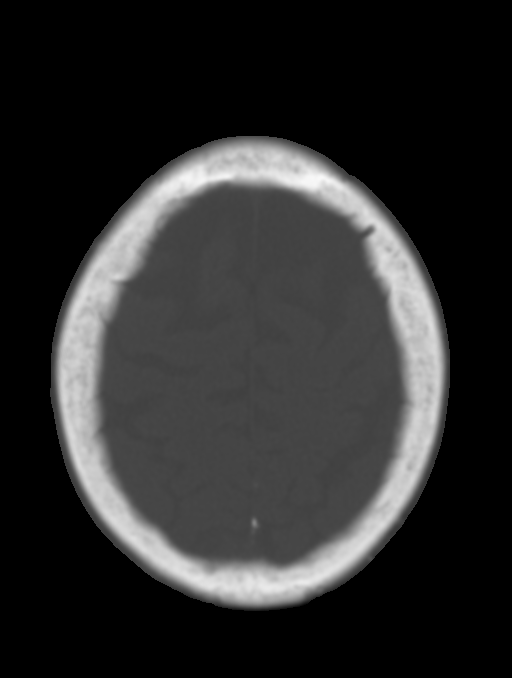
[im 25/29  brain]
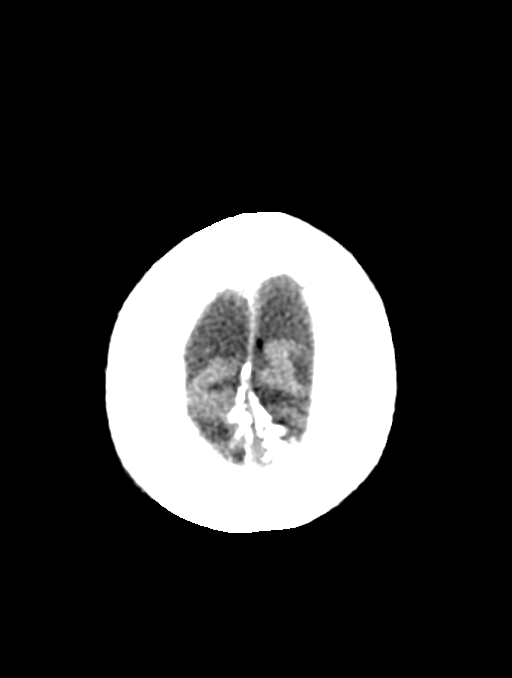

[Series 4: coronal soft tissue · coronal · 0.28mm/px · 3 of 70 slices shown]
[im 24/70  brain]
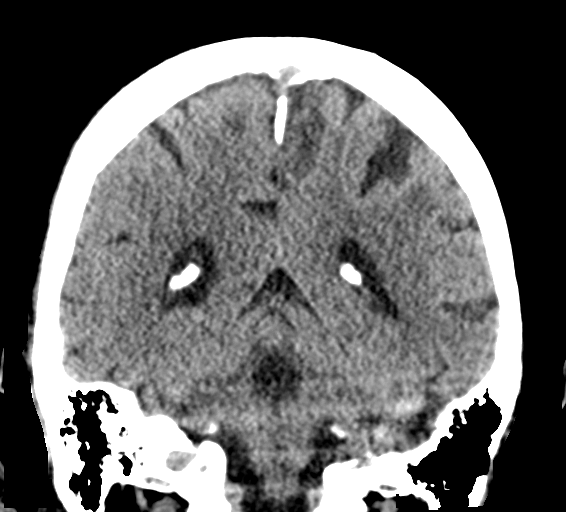
[im 31/70  brain]
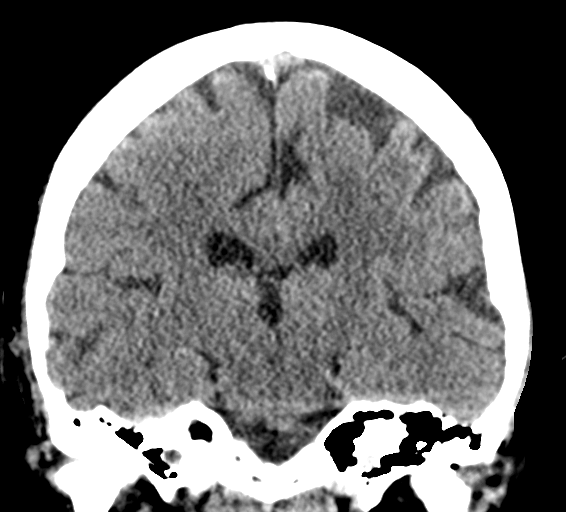
[im 39/70  brain]
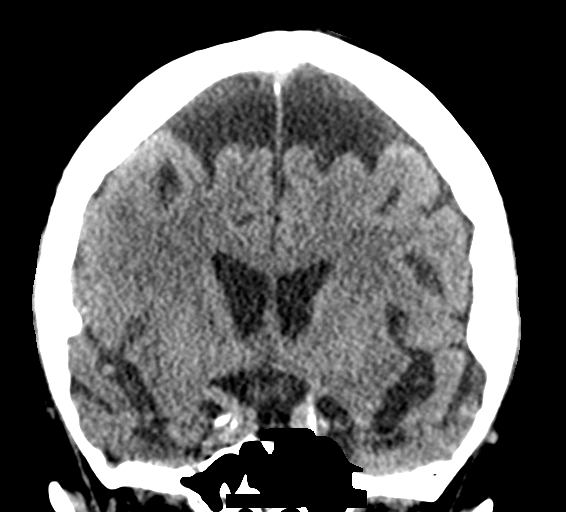

[Series 5: sagittal soft tissue · sagittal · 0.28mm/px · 3 of 53 slices shown]
[im 18/53  brain]
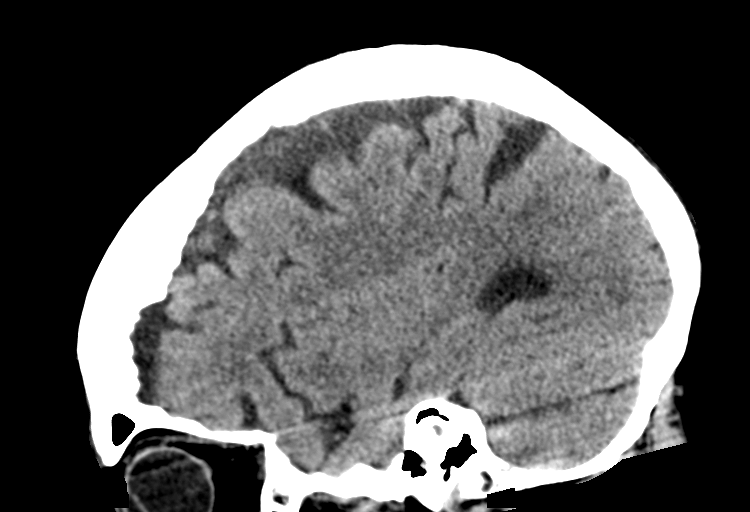
[im 27/53  brain]
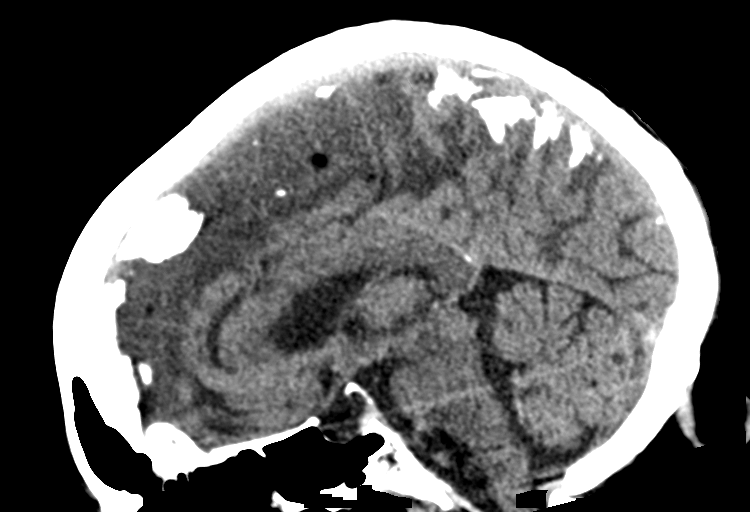
[im 35/53  brain]
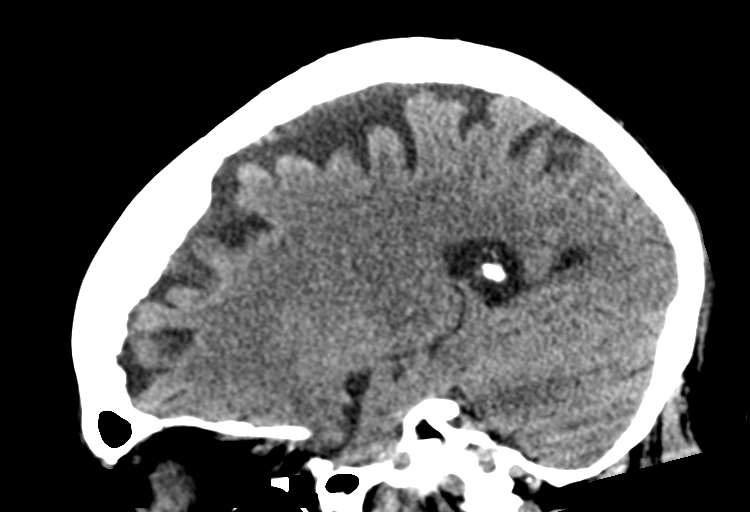

[12 of 47 positions shown; findings below may reference images not displayed]

FINDINGS: CT HEAD FINDINGS

Brain: The brainstem, cerebellum, cerebral peduncles, thalami, basal
ganglia, basilar cisterns, and ventricular system appear within
normal limits. No intracranial hemorrhage, mass lesion, or acute
CVA.

Vascular: Minimal atherosclerotic calcification of the cavernous
carotid arteries. Otherwise unremarkable.

Skull: Mild hyperostosis frontalis interna.

Sinuses/Orbits: Mild chronic ethmoid sinusitis.

Other: Stable right occipitoparietal scalp hematoma.

CT CERVICAL SPINE FINDINGS

Alignment: Degenerative mild grade 1 anterolisthesis at C4-5 and
C7-T1 as well as T1-2 and T2-3, not appreciably changed.

Skull base and vertebrae: Fused left facet joints and mild
interspinous fusion at C2-3, chronic. Degenerative arthropathy at
C1-2 anteriorly and on the right. No cervical spine fracture or
acute subluxation is identified. Minimal anterior wedging at T1,
probably chronic, and unchanged. No significant prevertebral soft
tissue swelling.

Soft tissues and spinal canal: Mild nodularity of the thyroid gland
measuring up to 1.4 cm in diameter. Not clinically significant; no
follow-up imaging recommended (ref: [HOSPITAL]. [DATE]): 143-50).Mild bilateral common carotid atherosclerotic
calcification.

Disc levels: Cervical spondylosis and uncinate spurring but without
overt foraminal impingement.

Upper chest: Mild biapical pleuroparenchymal scarring.

Other: No supplemental non-categorized findings.
IMPRESSION: 1. No acute intracranial findings or acute cervical spine findings.
2. Stable right occipitoparietal scalp hematoma.
3. Mild chronic ethmoid sinusitis.
4. Cervical spondylosis and uncinate spurring.
